# Patient Record
Sex: Female | Born: 1990 | Race: White | Hispanic: No | Marital: Single | Smoking: Never smoker
Health system: Southern US, Community
[De-identification: ages and names within clinical notes are randomized; demographics above are authoritative.]

## PROBLEM LIST (undated history)

## (undated) DIAGNOSIS — F419 Anxiety disorder, unspecified: Secondary | ICD-10-CM

## (undated) DIAGNOSIS — J302 Other seasonal allergic rhinitis: Secondary | ICD-10-CM

## (undated) DIAGNOSIS — K219 Gastro-esophageal reflux disease without esophagitis: Secondary | ICD-10-CM

## (undated) DIAGNOSIS — T7840XA Allergy, unspecified, initial encounter: Secondary | ICD-10-CM

## (undated) HISTORY — PX: ESOPHAGOGASTRODUODENOSCOPY ENDOSCOPY: SHX5814

## (undated) HISTORY — PX: TONSILLECTOMY: SUR1361

## (undated) HISTORY — DX: Gastro-esophageal reflux disease without esophagitis: K21.9

## (undated) HISTORY — DX: Allergy, unspecified, initial encounter: T78.40XA

## (undated) HISTORY — DX: Anxiety disorder, unspecified: F41.9

---
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
## 2011-03-18 NOTE — Op Note (Signed)
NAMECHERYLYNN, Natalie Ellis            ACCOUNT NO.:  [id number]   MEDICAL RECORD NO.:  [id number]          PATIENT TYPE:  AMB   LOCATION:  SDS                          FACILITY:  [facility]   PHYSICIAN:  Jon Gills, M.D.  DATE OF BIRTH:  07/29/1991   DATE OF PROCEDURE:  04/13/2009  DATE OF DISCHARGE:  04/13/2009                               OPERATIVE REPORT   PREOPERATIVE DIAGNOSIS:  Epigastric abdominal pain.   POSTOPERATIVE DIAGNOSIS:  Epigastric abdominal pain.   NAME OF PROCEDURE:  Upper gastrointestinal endoscopy with biopsy.   SURGEON:  Jon Gills, MD   ASSISTANT:  None.   DESCRIPTION OF FINDINGS:  Following informed written consent, the  patient was taken to the operating room and placed under general  anesthesia with continuous cardiopulmonary monitoring.  She remained in  the supine position, and the Pentax upper GI endoscope was passed by  mouth and advanced without difficulty.  A competent lower esophageal  sphincter was present 35 cm from the incisors.  There was no visual  evidence of esophagitis, gastritis, duodenitis, or peptic ulcer disease.  A solitary gastric biopsy was negative for Helicobacter by CLO testing.  Multiple esophageal, gastric, and duodenal biopsies were histologically  normal.  The endoscope was gradually withdrawn, and the patient was  awakened and taken to recovery room in satisfactory condition.  She will  be released later today to the care of her family.   DESCRIPTION OF TECHNICAL PROCEDURES USED:  Pentax upper GI endoscope  with cold biopsy forceps.   DESCRIPTION OF SPECIMENS REMOVED:  Esophagus x3 in formalin, gastric x 1  for CLOtesting, gastric x3 in formalin ,and duodenum x3 in formalin.           ______________________________  Jon Gills, M.D.     JHC/MEDQ  D:  04/27/2009  T:  04/27/2009  Job:  [id number]   cc:   Mila Merry

## 2011-05-05 ENCOUNTER — Emergency Department: Payer: Self-pay | Admitting: Emergency Medicine

## 2011-12-31 IMAGING — CT CT STONE STUDY
1 of 2 series · 15 of 32 positions shown, 19 images · non-contrast
Comparison: none

REASON FOR EXAM: pain right lower quadrant , hematuria and uti
COMMENTS:

[Series 2: stone · axial · 0.62mm/px · z∈[-468,-78]mm · 15 of 147 slices shown, 19 images]
[im 11/147  soft-tissue]
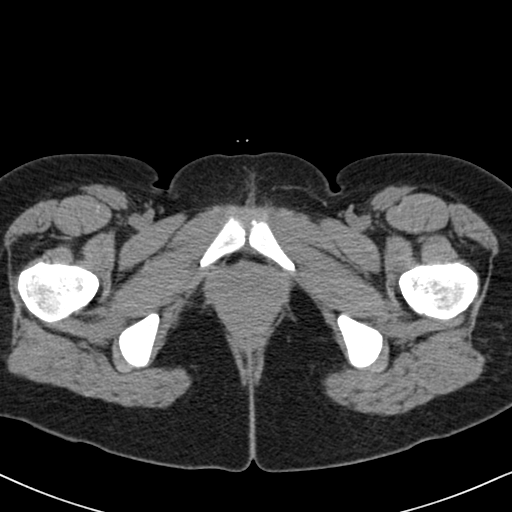
[im 11/147  bone]
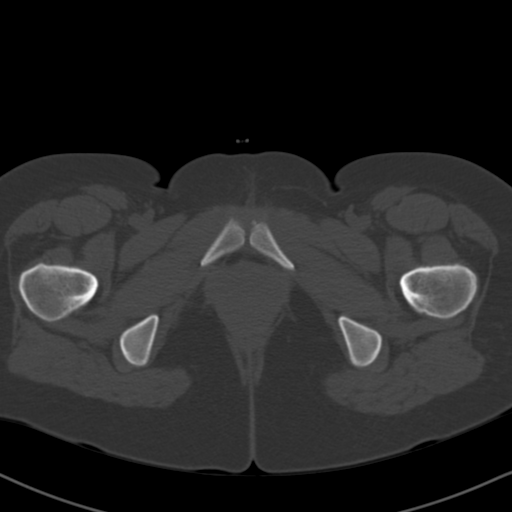
[im 22/147  soft-tissue]
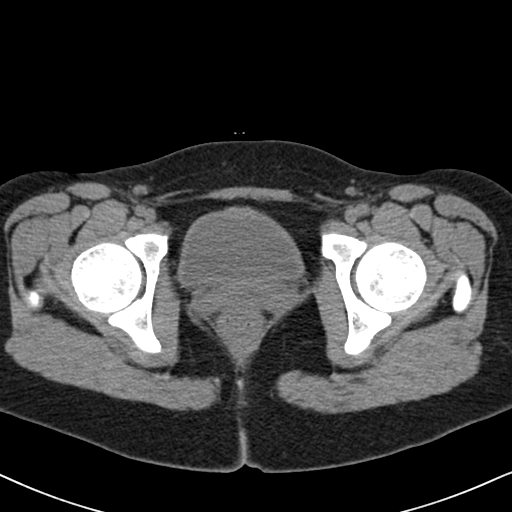
[im 33/147  soft-tissue]
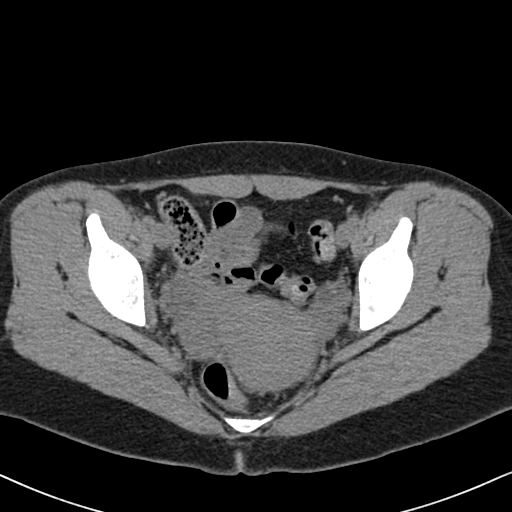
[im 44/147  soft-tissue]
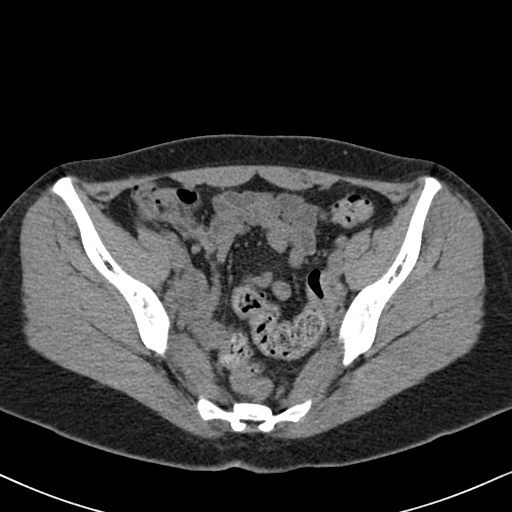
[im 55/147  soft-tissue]
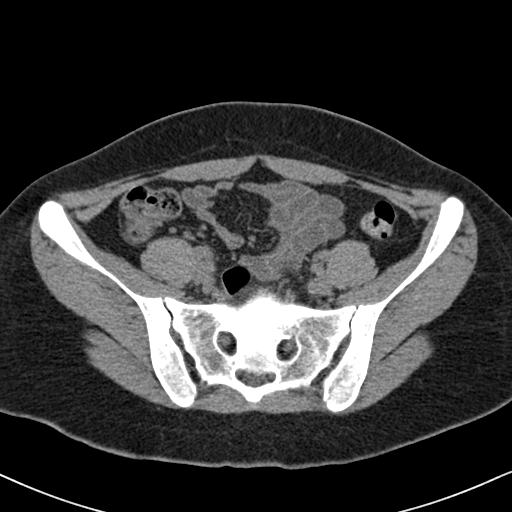
[im 65/147  soft-tissue]
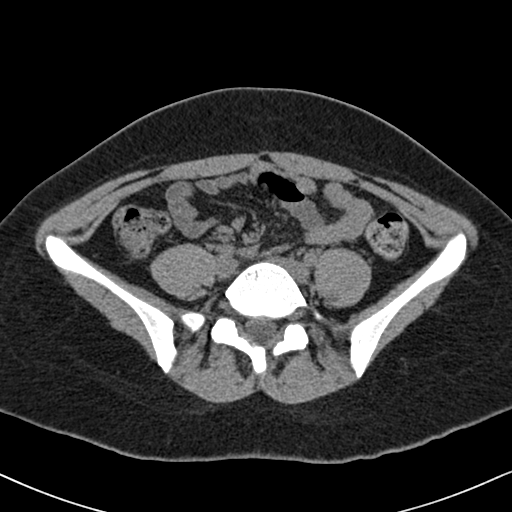
[im 76/147  soft-tissue]
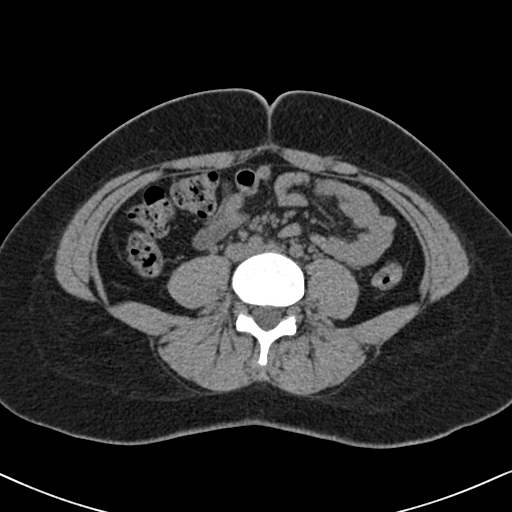
[im 87/147  soft-tissue]
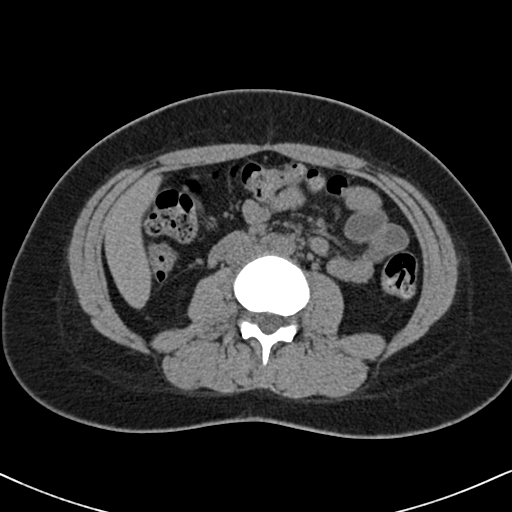
[im 98/147  soft-tissue]
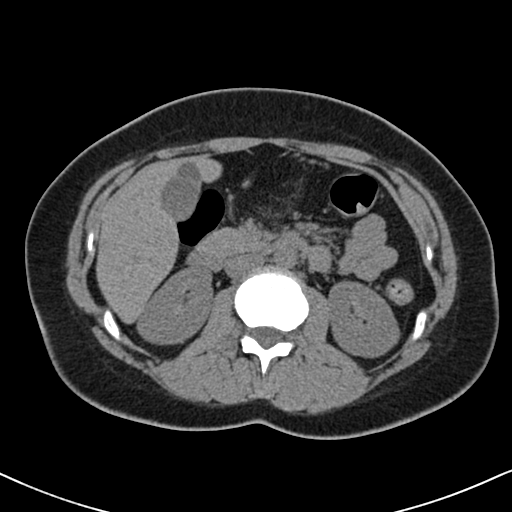
[im 98/147  bone]
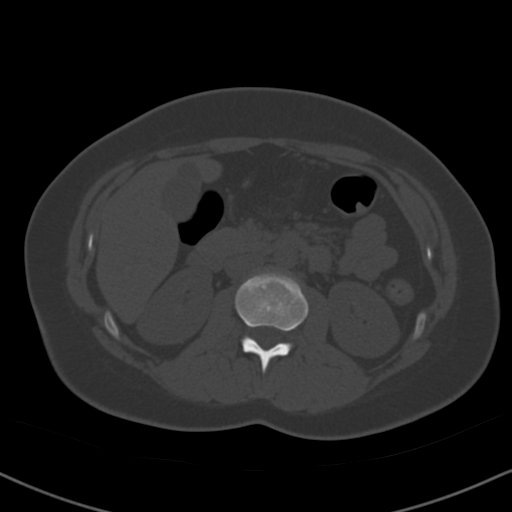
[im 109/147  soft-tissue]
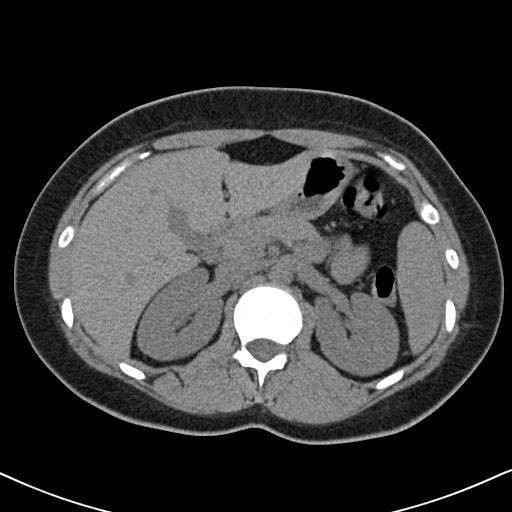
[im 119/147  soft-tissue]
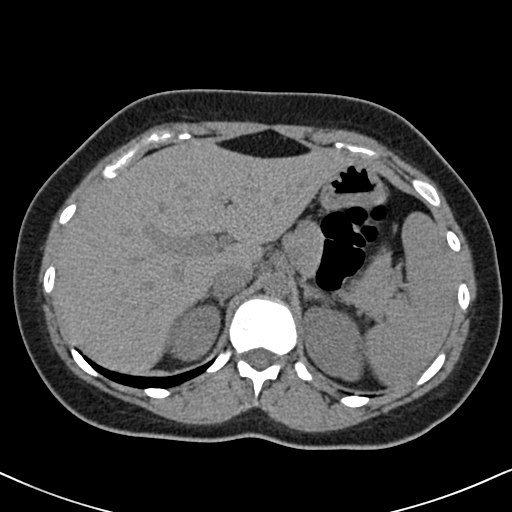
[im 125/147  lung]
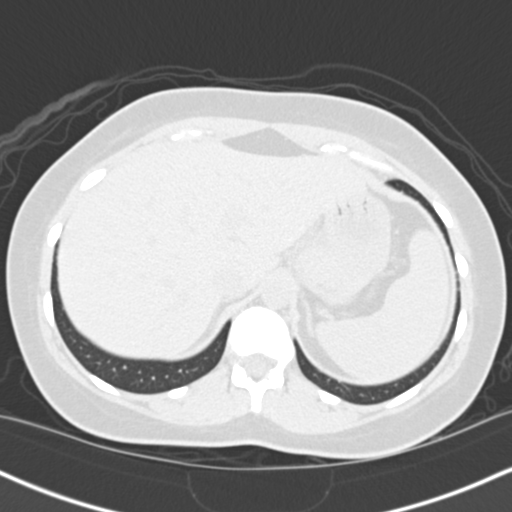
[im 130/147  soft-tissue]
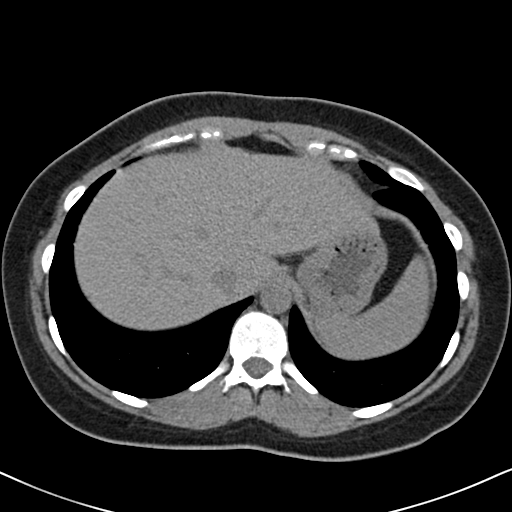
[im 130/147  lung]
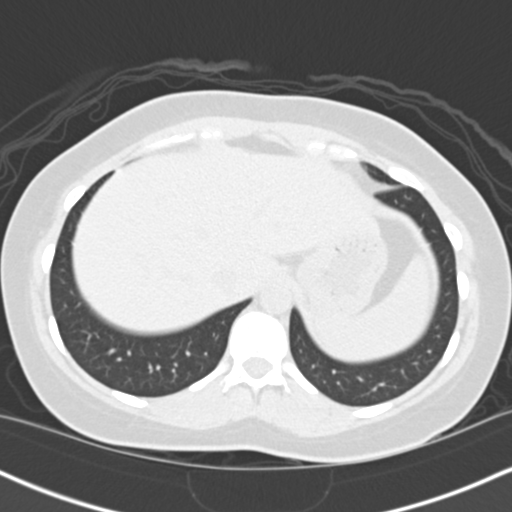
[im 136/147  lung]
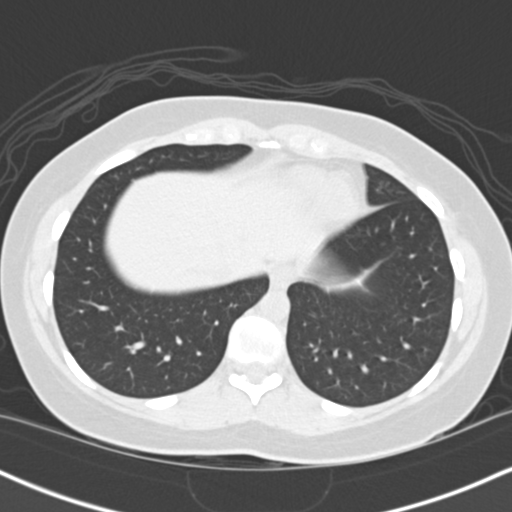
[im 141/147  soft-tissue]
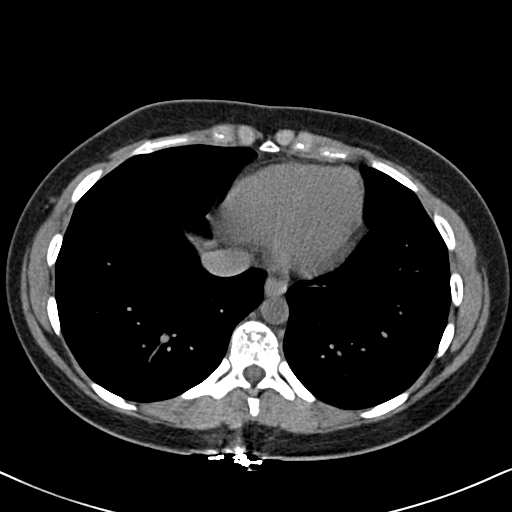
[im 141/147  lung]
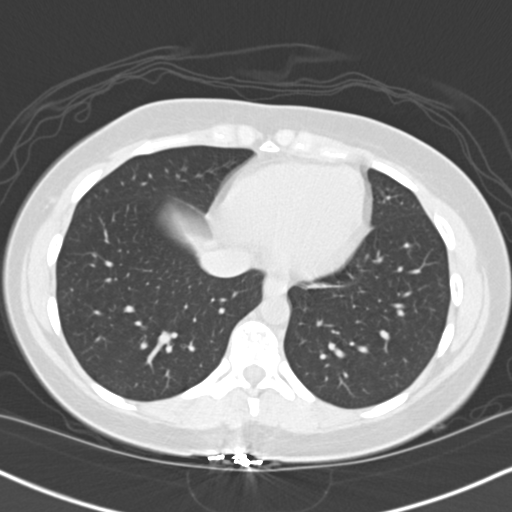

[15 of 32 positions shown; findings below may reference images not displayed]

PROCEDURE:     CT  - CT ABDOMEN /PELVIS WO (STONE)  - May 05, 2011  [DATE]

RESULT:     CT of the abdomen and pelvis without contrast is reconstructed
at 3.0 mm slice thickness. The patient has a previous CT performed with
contrast on one four 29 9131.

The kidneys demonstrate no obstruction or nephrolithiasis. No
ureterolithiasis is evident. No bladder calculi are demonstrated. The
visualized appendix appears within normal limits. Right adnexal fullness is
noted. Correlate for underlying adnexal lesion with consideration for pelvic
ultrasound. The appendix is a superior to this. The liver, spleen, kidneys,
gallbladder, pancreas and adrenal glands as well as the aorta appear
unremarkable. The urinary bladder is relatively decompressed. The lung bases
are clear.
IMPRESSION: 1. Right adnexal fullness. Pelvic ultrasound may be beneficial for further
investigation. Otherwise, unremarkable study.

## 2015-11-04 HISTORY — PX: WISDOM TOOTH EXTRACTION: SHX21

## 2016-01-24 ENCOUNTER — Emergency Department (HOSPITAL_COMMUNITY)
Admission: EM | Admit: 2016-01-24 | Discharge: 2016-01-25 | Disposition: A | Discharge status: Home / Self Care | Payer: Self-pay | Attending: Emergency Medicine | Admitting: Emergency Medicine

## 2016-01-24 ENCOUNTER — Encounter (HOSPITAL_COMMUNITY): Payer: Self-pay | Admitting: Emergency Medicine

## 2016-01-24 DIAGNOSIS — W228XXA Striking against or struck by other objects, initial encounter: Secondary | ICD-10-CM | POA: Insufficient documentation

## 2016-01-24 DIAGNOSIS — Y998 Other external cause status: Secondary | ICD-10-CM | POA: Insufficient documentation

## 2016-01-24 DIAGNOSIS — Y9289 Other specified places as the place of occurrence of the external cause: Secondary | ICD-10-CM | POA: Insufficient documentation

## 2016-01-24 DIAGNOSIS — Y9389 Activity, other specified: Secondary | ICD-10-CM | POA: Insufficient documentation

## 2016-01-24 DIAGNOSIS — Z3202 Encounter for pregnancy test, result negative: Secondary | ICD-10-CM | POA: Insufficient documentation

## 2016-01-24 DIAGNOSIS — S060X0A Concussion without loss of consciousness, initial encounter: Secondary | ICD-10-CM | POA: Insufficient documentation

## 2016-01-24 DIAGNOSIS — Z79899 Other long term (current) drug therapy: Secondary | ICD-10-CM | POA: Insufficient documentation

## 2016-01-24 NOTE — ED Notes (Signed)
Pt was trying to lay back on her bed but accidentally hit the back of her head on the wall. Pt's friend states she blacked out for a few second "7-8". The pt states she then was really lethargic afterwards and now has complaints of a headache

## 2016-01-25 LAB — POC URINE PREG, ED: Preg Test, Ur: NEGATIVE

## 2016-01-25 MED ORDER — KETOROLAC TROMETHAMINE 15 MG/ML IJ SOLN
15.0000 mg | Freq: Once | INTRAMUSCULAR | Status: AC
  Administered 2016-01-25: 15 mg via INTRAMUSCULAR
  Filled 2016-01-25: qty 1

## 2016-01-25 MED ORDER — PROMETHAZINE HCL 25 MG PO TABS: 25.0000 mg | ORAL_TABLET | Freq: Four times a day (QID) | ORAL | Status: DC | PRN

## 2016-01-25 MED ORDER — BUTALBITAL-APAP-CAFFEINE 50-325-40 MG PO TABS: 1.0000 | ORAL_TABLET | Freq: Three times a day (TID) | ORAL | Status: AC | PRN

## 2016-01-25 NOTE — Discharge Instructions (Signed)
Your neurologic exam today was normal. We have a low suspicion for bleeding in your brain, fluid around your brain, or a skull fracture. For this reason, he did not have a CT performed. You have been placed on head injury precautions. While on these precautions, you may not drive a motor vehicle or operate heavy machinery. No strenuous activity or heavy lifting. No contact sports. Follow-up with your primary care doctor in 1 week to be cleared from these precautions. Take Fioricet as needed for headache. You may take Phenergan as needed for nausea. Return to the emergency department if symptoms worsen.  Concussion, Adult A concussion, or closed-head injury, is a brain injury caused by a direct blow to the head or by a quick and sudden movement (jolt) of the head or neck. Concussions are usually not life-threatening. Even so, the effects of a concussion can be serious. If you have had a concussion before, you are more likely to experience concussion-like symptoms after a direct blow to the head.  CAUSES  Direct blow to the head, such as from running into another player during a soccer game, being hit in a fight, or hitting your head on a hard surface.  A jolt of the head or neck that causes the brain to move back and forth inside the skull, such as in a car crash. SIGNS AND SYMPTOMS The signs of a concussion can be hard to notice. Early on, they may be missed by you, family members, and health care providers. You may look fine but act or feel differently. Symptoms are usually temporary, but they may last for days, weeks, or even longer. Some symptoms may appear right away while others may not show up for hours or days. Every head injury is different. Symptoms include:  Mild to moderate headaches that will not go away.  A feeling of pressure inside your head.  Having more trouble than usual:  Learning or remembering things you have heard.  Answering questions.  Paying attention or  concentrating.  Organizing daily tasks.  Making decisions and solving problems.  Slowness in thinking, acting or reacting, speaking, or reading.  Getting lost or being easily confused.  Feeling tired all the time or lacking energy (fatigued).  Feeling drowsy.  Sleep disturbances.  Sleeping more than usual.  Sleeping less than usual.  Trouble falling asleep.  Trouble sleeping (insomnia).  Loss of balance or feeling lightheaded or dizzy.  Nausea or vomiting.  Numbness or tingling.  Increased sensitivity to:  Sounds.  Lights.  Distractions.  Vision problems or eyes that tire easily.  Diminished sense of taste or smell.  Ringing in the ears.  Mood changes such as feeling sad or anxious.  Becoming easily irritated or angry for little or no reason.  Lack of motivation.  Seeing or hearing things other people do not see or hear (hallucinations). DIAGNOSIS Your health care provider can usually diagnose a concussion based on a description of your injury and symptoms. He or she will ask whether you passed out (lost consciousness) and whether you are having trouble remembering events that happened right before and during your injury. Your evaluation might include:  A brain scan to look for signs of injury to the brain. Even if the test shows no injury, you may still have a concussion.  Blood tests to be sure other problems are not present. TREATMENT  Concussions are usually treated in an emergency department, in urgent care, or at a clinic. You may need to stay in  the hospital overnight for further treatment.  Tell your health care provider if you are taking any medicines, including prescription medicines, over-the-counter medicines, and natural remedies. Some medicines, such as blood thinners (anticoagulants) and aspirin, may increase the chance of complications. Also tell your health care provider whether you have had alcohol or are taking illegal drugs. This  information may affect treatment.  Your health care provider will send you home with important instructions to follow.  How fast you will recover from a concussion depends on many factors. These factors include how severe your concussion is, what part of your brain was injured, your age, and how healthy you were before the concussion.  Most people with mild injuries recover fully. Recovery can take time. In general, recovery is slower in older persons. Also, persons who have had a concussion in the past or have other medical problems may find that it takes longer to recover from their current injury. HOME CARE INSTRUCTIONS General Instructions  Carefully follow the directions your health care provider gave you.  Only take over-the-counter or prescription medicines for pain, discomfort, or fever as directed by your health care provider.  Take only those medicines that your health care provider has approved.  Do not drink alcohol until your health care provider says you are well enough to do so. Alcohol and certain other drugs may slow your recovery and can put you at risk of further injury.  If it is harder than usual to remember things, write them down.  If you are easily distracted, try to do one thing at a time. For example, do not try to watch TV while fixing dinner.  Talk with family members or close friends when making important decisions.  Keep all follow-up appointments. Repeated evaluation of your symptoms is recommended for your recovery.  Watch your symptoms and tell others to do the same. Complications sometimes occur after a concussion. Older adults with a brain injury may have a higher risk of serious complications, such as a blood clot on the brain.  Tell your teachers, school nurse, school counselor, coach, athletic trainer, or work Production designer, theatre/television/filmmanager about your injury, symptoms, and restrictions. Tell them about what you can or cannot do. They should watch for:  Increased problems  with attention or concentration.  Increased difficulty remembering or learning new information.  Increased time needed to complete tasks or assignments.  Increased irritability or decreased ability to cope with stress.  Increased symptoms.  Rest. Rest helps the brain to heal. Make sure you:  Get plenty of sleep at night. Avoid staying up late at night.  Keep the same bedtime hours on weekends and weekdays.  Rest during the day. Take daytime naps or rest breaks when you feel tired.  Limit activities that require a lot of thought or concentration. These include:  Doing homework or job-related work.  Watching TV.  Working on the computer.  Avoid any situation where there is potential for another head injury (football, hockey, soccer, basketball, martial arts, downhill snow sports and horseback riding). Your condition will get worse every time you experience a concussion. You should avoid these activities until you are evaluated by the appropriate follow-up health care providers. Returning To Your Regular Activities You will need to return to your normal activities slowly, not all at once. You must give your body and brain enough time for recovery.  Do not return to sports or other athletic activities until your health care provider tells you it is safe to do so.  Ask your health care provider when you can drive, ride a bicycle, or operate heavy machinery. Your ability to react may be slower after a brain injury. Never do these activities if you are dizzy.  Ask your health care provider about when you can return to work or school. Preventing Another Concussion It is very important to avoid another brain injury, especially before you have recovered. In rare cases, another injury can lead to permanent brain damage, brain swelling, or death. The risk of this is greatest during the first 7-10 days after a head injury. Avoid injuries by:  Wearing a seat belt when riding in a  car.  Drinking alcohol only in moderation.  Wearing a helmet when biking, skiing, skateboarding, skating, or doing similar activities.  Avoiding activities that could lead to a second concussion, such as contact or recreational sports, until your health care provider says it is okay.  Taking safety measures in your home.  Remove clutter and tripping hazards from floors and stairways.  Use grab bars in bathrooms and handrails by stairs.  Place non-slip mats on floors and in bathtubs.  Improve lighting in dim areas. SEEK MEDICAL CARE IF:  You have increased problems paying attention or concentrating.  You have increased difficulty remembering or learning new information.  You need more time to complete tasks or assignments than before.  You have increased irritability or decreased ability to cope with stress.  You have more symptoms than before. Seek medical care if you have any of the following symptoms for more than 2 weeks after your injury:  Lasting (chronic) headaches.  Dizziness or balance problems.  Nausea.  Vision problems.  Increased sensitivity to noise or light.  Depression or mood swings.  Anxiety or irritability.  Memory problems.  Difficulty concentrating or paying attention.  Sleep problems.  Feeling tired all the time. SEEK IMMEDIATE MEDICAL CARE IF:  You have severe or worsening headaches. These may be a sign of a blood clot in the brain.  You have weakness (even if only in one hand, leg, or part of the face).  You have numbness.  You have decreased coordination.  You vomit repeatedly.  You have increased sleepiness.  One pupil is larger than the other.  You have convulsions.  You have slurred speech.  You have increased confusion. This may be a sign of a blood clot in the brain.  You have increased restlessness, agitation, or irritability.  You are unable to recognize people or places.  You have neck pain.  It is difficult  to wake you up.  You have unusual behavior changes.  You lose consciousness. MAKE SURE YOU:  Understand these instructions.  Will watch your condition.  Will get help right away if you are not doing well or get worse.   This information is not intended to replace advice given to you by your health care provider. Make sure you discuss any questions you have with your health care provider.   Document Released: 01/10/2004 Document Revised: 11/10/2014 Document Reviewed: 05/12/2013 Elsevier Interactive Patient Education [employer]

## 2016-01-25 NOTE — ED Provider Notes (Signed)
CSN: [id number]     Arrival date & time 01/24/16  2218 History   First MD Initiated Contact with Patient 01/25/16 0024     Chief Complaint  Patient presents with  . Head Injury     (Consider location/radiation/quality/duration/timing/severity/associated sxs/prior Treatment) HPI Comments: 25 year old female with no significant past medical history presents to the emergency department for further evaluation of head injury which occurred this evening at 2110. Patient reports that she was laying back on the bed when her head went into the wall. She denies loss of consciousness, but does report seeing black spots for 6-7 seconds. Patient felt tired and fell asleep shortly after. She is complaining of a posterior parietal headache with associated photophobia and intermittent blurry vision. Patient reports nausea without emesis. No extremity numbness or weakness. No medications taken prior to arrival.  Patient is a 25 y.o. female presenting with head injury. The history is provided by the patient. No language interpreter was used.  Head Injury Associated symptoms: headache and nausea   Associated symptoms: no hearing loss, no numbness, no tinnitus and no vomiting     History reviewed. No pertinent past medical history. Past Surgical History  Procedure Laterality Date  . Tonsillectomy     No family history on file. Social History  Substance Use Topics  . Smoking status: Never Smoker   . Smokeless tobacco: None  . Alcohol Use: Yes     Comment: socially   OB History    No data available      Review of Systems  HENT: Negative for hearing loss and tinnitus.   Eyes: Positive for photophobia and visual disturbance.  Gastrointestinal: Positive for nausea. Negative for vomiting.  Musculoskeletal: Negative for gait problem.  Neurological: Positive for headaches. Negative for syncope, weakness and numbness.  All other systems reviewed and are negative.   Allergies  Other  Home  Medications   Prior to Admission medications   Medication Sig Start Date End Date Taking? Authorizing Provider  Phenylephrine-Acetaminophen (VICKS SINEX DAYTIME PO) Take 1 capsule by mouth every 6 (six) hours.   Yes Historical Provider, MD  butalbital-acetaminophen-caffeine (FIORICET) 50-325-40 MG tablet Take 1-2 tablets by mouth every 8 (eight) hours as needed for headache or migraine. 01/25/16 01/24/17  Antony MaduraKelly Olumide Dolinger, PA-C  promethazine (PHENERGAN) 25 MG tablet Take 1 tablet (25 mg total) by mouth every 6 (six) hours as needed for nausea or vomiting. 01/25/16   Antony MaduraKelly Bambie Pizzolato, PA-C   BP 124/88 mmHg  Pulse 80  Temp(Src) 98.1 F (36.7 C) (Oral)  Resp 16  Ht 5\' 7"  (1.702 m)  Wt 68.04 kg  BMI 23.49 kg/m2  SpO2 100%   Physical Exam  Constitutional: She is oriented to person, place, and time. She appears well-developed and well-nourished. No distress.  HENT:  Head: Normocephalic and atraumatic.  Mouth/Throat: Oropharynx is clear and moist. No oropharyngeal exudate.  No Battle sign or raccoons eyes. No skull and stability, contusion, or hematoma to scalp. Symmetric rise of the uvula with phonation.  Eyes: Conjunctivae and EOM are normal. Pupils are equal, round, and reactive to light. No scleral icterus.  EOMs normal without nystagmus.  Neck: Normal range of motion.  No nuchal rigidity or meningismus  Cardiovascular: Normal rate, regular rhythm and intact distal pulses.   Pulmonary/Chest: Effort normal. No respiratory distress.  Respirations even and unlabored  Musculoskeletal: Normal range of motion.  Neurological: She is alert and oriented to person, place, and time. No cranial nerve deficit. She exhibits normal muscle tone.  Coordination normal.  GCS 15. Speech is goal oriented. No cranial nerve deficits appreciated; symmetric eyebrow raise, no facial drooping, tongue midline. Patient has equal grip strength bilaterally with 5/5 strength against resistance in all major muscle groups  bilaterally. Sensation to light touch intact. Patient moves extremities without ataxia; normal finger-nose-finger. Patient ambulatory with steady gait.  Skin: Skin is warm and dry. No rash noted. She is not diaphoretic. No erythema. No pallor.  Psychiatric: She has a normal mood and affect. Her behavior is normal.  Nursing note and vitals reviewed.   ED Course  Procedures (including critical care time) Labs Review Labs Reviewed  POC URINE PREG, ED    Imaging Review No results found.   I have personally reviewed and evaluated these images and lab results as part of my medical decision-making.   EKG Interpretation None      MDM   Final diagnoses:  Concussion, without loss of consciousness, initial encounter    25 year old female presents to the emergency department for evaluation of a head injury secondary to hitting her head on a wall tonight. No reported LOC. No Battle sign or raccoons eyes on exam. Neurologic exam nonfocal. Patient complaining of a residual posterior parietal headache consistent with posttraumatic headache. Doubt skull fracture, hemorrhage, hydrocephalus. No indication for CT at this time. Patient treated with Toradol for headache. She has been placed on head injury precautions were one week. Return precautions discussed and provided. Patient discharged in satisfactory condition area and patient and family with no unaddressed concerns.   Filed Vitals:   01/24/16 2232  BP: 124/88  Pulse: 80  Temp: 98.1 F (36.7 C)  TempSrc: Oral  Resp: 16  Height:  (1.702 m)  Weight: 68.04 kg  SpO2: 100%     Antony Madura, PA-C 01/25/16 0209  Cy Blamer, MD 01/25/16 [phone]

## 2017-04-06 ENCOUNTER — Encounter: Payer: Self-pay | Admitting: Obstetrics and Gynecology

## 2017-04-09 ENCOUNTER — Ambulatory Visit (INDEPENDENT_AMBULATORY_CARE_PROVIDER_SITE_OTHER): Payer: Self-pay | Admitting: Obstetrics & Gynecology

## 2017-04-09 ENCOUNTER — Other Ambulatory Visit (HOSPITAL_COMMUNITY)
Admission: RE | Admit: 2017-04-09 | Discharge: 2017-04-09 | Disposition: A | Discharge status: Home / Self Care | Payer: Medicaid Other | Source: Ambulatory Visit | Attending: Obstetrics and Gynecology | Admitting: Obstetrics and Gynecology

## 2017-04-09 VITALS — BP 135/83 | HR 67 | Wt 157.2 lb

## 2017-04-09 DIAGNOSIS — G8929 Other chronic pain: Secondary | ICD-10-CM

## 2017-04-09 DIAGNOSIS — N938 Other specified abnormal uterine and vaginal bleeding: Secondary | ICD-10-CM | POA: Insufficient documentation

## 2017-04-09 DIAGNOSIS — R102 Pelvic and perineal pain: Secondary | ICD-10-CM | POA: Insufficient documentation

## 2017-04-09 NOTE — Progress Notes (Signed)
Patient reports taking birth control pills but does not know the name of them.

## 2017-04-09 NOTE — Addendum Note (Signed)
Addended by: Nicholaus BloomVE, Jaysen Wey on: 04/09/2017 08:48 AM   Modules accepted: Orders

## 2017-04-09 NOTE — Addendum Note (Signed)
Addended by: Sherre LainASH, Melody Savidge on: 04/09/2017 03:26 PM   Modules accepted: Orders

## 2017-04-09 NOTE — Progress Notes (Signed)
   Subjective:    Patient ID: Natalie LimberChelsea D Manlove, female    DOB: [birth year], 26 y.o.   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 She has now taken 2 months of continuous OCPs, is still bleeding and her pain is not improved. [facility] showed a possible intrauterine polyp as well as possible endometriomas.   Review of Systems She works at [employer], sometimes has to leave work due to the pain. She is currenlty in a same sex relationship, has had sex with males in the past. No pap ever    Objective:   Physical Exam [id number] Breathing, conversing, and ambulating normally Abd- benign       Assessment & Plan:  DUB on OCPs- ? Polyp on gyn u/s Check GC,CT, TSH Probable endometriosis- discussed OCPs versus dx lap. She would like dx lap DUB with possible polyp- plan for h/s, d&c at time of dx lap.

## 2017-04-10 LAB — URINE CYTOLOGY ANCILLARY ONLY
Chlamydia: NEGATIVE
NEISSERIA GONORRHEA: NEGATIVE

## 2017-04-10 LAB — TSH: TSH: 4.8 u[IU]/mL — ABNORMAL HIGH (ref 0.450–4.500)

## 2017-04-13 ENCOUNTER — Encounter (HOSPITAL_COMMUNITY): Payer: Self-pay

## 2017-04-14 ENCOUNTER — Telehealth: Payer: Self-pay

## 2017-04-14 NOTE — Telephone Encounter (Signed)
-----   Message from Allie BossierMyra C Dove, MD sent at 04/14/2017  1:46 PM EDT ----- She will need to see a primary care regarding her abnormal TSH. Thanks

## 2017-04-14 NOTE — Telephone Encounter (Signed)
Patient has been informed of TSH results.

## 2017-04-20 NOTE — Patient Instructions (Addendum)
Your procedure is scheduled on:  Thursday, June 21  Enter through the Main Entrance of [facility] at: 8:45 am  Pick up the phone at the desk and dial [phone].  Call this number if you have problems the morning of surgery: [phone].  Remember: Do NOT eat or drink clear liquids (including water) after midnight Wednesday  Take these medicines the morning of surgery with a SIP OF WATER:  None  Do Not smoke on the day of surgery.  Stop herbal medications and supplements at this time.  Do NOT wear jewelry (body piercing), metal hair clips/bobby pins, make-up, or nail polish. Do NOT wear lotions, powders, or perfumes.  You may wear deoderant. Do NOT shave for 48 hours prior to surgery. Do NOT bring valuables to the hospital.  Have a responsible adult drive you home and stay with you for 24 hours after your procedure.  Home with girlfriend Clydie BraunKaren cell [phone]

## 2017-04-21 ENCOUNTER — Encounter (HOSPITAL_COMMUNITY): Payer: Self-pay | Admitting: *Deleted

## 2017-04-21 ENCOUNTER — Encounter (HOSPITAL_COMMUNITY)
Admission: RE | Admit: 2017-04-21 | Discharge: 2017-04-21 | Disposition: A | Discharge status: Home / Self Care | Payer: Medicaid Other | Source: Ambulatory Visit | Attending: Obstetrics & Gynecology | Admitting: Obstetrics & Gynecology

## 2017-04-21 DIAGNOSIS — R102 Pelvic and perineal pain: Secondary | ICD-10-CM | POA: Insufficient documentation

## 2017-04-21 DIAGNOSIS — Z01812 Encounter for preprocedural laboratory examination: Secondary | ICD-10-CM | POA: Insufficient documentation

## 2017-04-21 HISTORY — DX: Other seasonal allergic rhinitis: J30.2

## 2017-04-21 LAB — CBC
HCT: 37.5 % (ref 36.0–46.0)
HEMOGLOBIN: 12.2 g/dL (ref 12.0–15.0)
MCH: 29.1 pg (ref 26.0–34.0)
MCHC: 32.5 g/dL (ref 30.0–36.0)
MCV: 89.5 fL (ref 78.0–100.0)
Platelets: 287 10*3/uL (ref 150–400)
RBC: 4.19 MIL/uL (ref 3.87–5.11)
RDW: 14 % (ref 11.5–15.5)
WBC: 8.2 10*3/uL (ref 4.0–10.5)

## 2017-04-23 ENCOUNTER — Ambulatory Visit (HOSPITAL_COMMUNITY)
Admission: RE | Admit: 2017-04-23 | Discharge: 2017-04-23 | Disposition: A | Discharge status: Home / Self Care | Payer: Self-pay | Source: Ambulatory Visit | Attending: Obstetrics & Gynecology | Admitting: Obstetrics & Gynecology

## 2017-04-23 ENCOUNTER — Encounter (HOSPITAL_COMMUNITY): Payer: Self-pay | Admitting: *Deleted

## 2017-04-23 ENCOUNTER — Encounter (HOSPITAL_COMMUNITY)
Admission: RE | Disposition: A | Discharge status: Home / Self Care | Payer: Self-pay | Source: Ambulatory Visit | Attending: Obstetrics & Gynecology

## 2017-04-23 ENCOUNTER — Ambulatory Visit (HOSPITAL_COMMUNITY): Payer: Self-pay | Admitting: Anesthesiology

## 2017-04-23 DIAGNOSIS — N840 Polyp of corpus uteri: Secondary | ICD-10-CM | POA: Insufficient documentation

## 2017-04-23 DIAGNOSIS — F17210 Nicotine dependence, cigarettes, uncomplicated: Secondary | ICD-10-CM | POA: Insufficient documentation

## 2017-04-23 DIAGNOSIS — N803 Endometriosis of pelvic peritoneum: Secondary | ICD-10-CM | POA: Insufficient documentation

## 2017-04-23 DIAGNOSIS — N938 Other specified abnormal uterine and vaginal bleeding: Secondary | ICD-10-CM | POA: Insufficient documentation

## 2017-04-23 DIAGNOSIS — R102 Pelvic and perineal pain: Secondary | ICD-10-CM

## 2017-04-23 DIAGNOSIS — G8929 Other chronic pain: Secondary | ICD-10-CM | POA: Insufficient documentation

## 2017-04-23 DIAGNOSIS — Z91018 Allergy to other foods: Secondary | ICD-10-CM | POA: Insufficient documentation

## 2017-04-23 DIAGNOSIS — Z79899 Other long term (current) drug therapy: Secondary | ICD-10-CM | POA: Insufficient documentation

## 2017-04-23 DIAGNOSIS — N7011 Chronic salpingitis: Secondary | ICD-10-CM | POA: Insufficient documentation

## 2017-04-23 HISTORY — PX: HYSTEROSCOPY WITH D & C: SHX1775

## 2017-04-23 HISTORY — PX: LAPAROSCOPY: SHX197

## 2017-04-23 LAB — PREGNANCY, URINE: PREG TEST UR: NEGATIVE

## 2017-04-23 SURGERY — LAPAROSCOPY, DIAGNOSTIC
Anesthesia: General | Site: Vagina

## 2017-04-23 MED ORDER — BUPIVACAINE HCL (PF) 0.5 % IJ SOLN
INTRAMUSCULAR | Status: DC | PRN
  Administered 2017-04-23: 10 mL

## 2017-04-23 MED ORDER — KETOROLAC TROMETHAMINE 30 MG/ML IJ SOLN
INTRAMUSCULAR | Status: DC | PRN
  Administered 2017-04-23: 30 mg via INTRAVENOUS

## 2017-04-23 MED ORDER — FENTANYL CITRATE (PF) 250 MCG/5ML IJ SOLN
INTRAMUSCULAR | Status: DC | PRN
  Administered 2017-04-23: 100 ug via INTRAVENOUS
  Administered 2017-04-23 (×3): 50 ug via INTRAVENOUS

## 2017-04-23 MED ORDER — FENTANYL CITRATE (PF) 250 MCG/5ML IJ SOLN
INTRAMUSCULAR | Status: AC
  Filled 2017-04-23: qty 5

## 2017-04-23 MED ORDER — MEPERIDINE HCL 25 MG/ML IJ SOLN: 6.2500 mg | INTRAMUSCULAR | Status: DC | PRN

## 2017-04-23 MED ORDER — LIDOCAINE HCL (CARDIAC) 20 MG/ML IV SOLN
INTRAVENOUS | Status: DC | PRN
  Administered 2017-04-23: 50 mg via INTRAVENOUS
  Administered 2017-04-23: 20 mg via INTRAVENOUS
  Administered 2017-04-23: 30 mg via INTRAVENOUS

## 2017-04-23 MED ORDER — MIDAZOLAM HCL 5 MG/5ML IJ SOLN
INTRAMUSCULAR | Status: DC | PRN
  Administered 2017-04-23: 2 mg via INTRAVENOUS

## 2017-04-23 MED ORDER — DEXAMETHASONE SODIUM PHOSPHATE 10 MG/ML IJ SOLN
INTRAMUSCULAR | Status: DC | PRN
  Administered 2017-04-23: 4 mg via INTRAVENOUS

## 2017-04-23 MED ORDER — SUGAMMADEX SODIUM 500 MG/5ML IV SOLN
INTRAVENOUS | Status: DC | PRN
  Administered 2017-04-23: 200 mg via INTRAVENOUS

## 2017-04-23 MED ORDER — DEXAMETHASONE SODIUM PHOSPHATE 4 MG/ML IJ SOLN
INTRAMUSCULAR | Status: AC
  Filled 2017-04-23: qty 1

## 2017-04-23 MED ORDER — SCOPOLAMINE 1 MG/3DAYS TD PT72
1.0000 | MEDICATED_PATCH | Freq: Once | TRANSDERMAL | Status: DC
  Administered 2017-04-23: 1.5 mg via TRANSDERMAL

## 2017-04-23 MED ORDER — ACETAMINOPHEN 325 MG PO TABS: 325.0000 mg | ORAL_TABLET | ORAL | Status: DC | PRN

## 2017-04-23 MED ORDER — ACETAMINOPHEN 160 MG/5ML PO SOLN: 325.0000 mg | ORAL | Status: DC | PRN

## 2017-04-23 MED ORDER — LIDOCAINE HCL (CARDIAC) 20 MG/ML IV SOLN
INTRAVENOUS | Status: AC
  Filled 2017-04-23: qty 5

## 2017-04-23 MED ORDER — MIDAZOLAM HCL 2 MG/2ML IJ SOLN
INTRAMUSCULAR | Status: AC
  Filled 2017-04-23: qty 2

## 2017-04-23 MED ORDER — SUGAMMADEX SODIUM 200 MG/2ML IV SOLN
INTRAVENOUS | Status: AC
  Filled 2017-04-23: qty 2

## 2017-04-23 MED ORDER — KETOROLAC TROMETHAMINE 30 MG/ML IJ SOLN
INTRAMUSCULAR | Status: AC
  Filled 2017-04-23: qty 1

## 2017-04-23 MED ORDER — OXYCODONE-ACETAMINOPHEN 5-325 MG PO TABS: 1.0000 | ORAL_TABLET | Freq: Four times a day (QID) | ORAL | 0 refills | Status: DC | PRN

## 2017-04-23 MED ORDER — IBUPROFEN 600 MG PO TABS: 600.0000 mg | ORAL_TABLET | Freq: Four times a day (QID) | ORAL | 1 refills | Status: DC | PRN

## 2017-04-23 MED ORDER — KETOROLAC TROMETHAMINE 30 MG/ML IJ SOLN: 30.0000 mg | Freq: Once | INTRAMUSCULAR | Status: DC | PRN

## 2017-04-23 MED ORDER — SCOPOLAMINE 1 MG/3DAYS TD PT72
MEDICATED_PATCH | TRANSDERMAL | Status: AC
  Administered 2017-04-23: 1.5 mg via TRANSDERMAL
  Filled 2017-04-23: qty 1

## 2017-04-23 MED ORDER — PROPOFOL 10 MG/ML IV BOLUS
INTRAVENOUS | Status: DC | PRN
  Administered 2017-04-23: 150 mg via INTRAVENOUS

## 2017-04-23 MED ORDER — ONDANSETRON HCL 4 MG/2ML IJ SOLN: 4.0000 mg | Freq: Once | INTRAMUSCULAR | Status: DC | PRN

## 2017-04-23 MED ORDER — FENTANYL CITRATE (PF) 100 MCG/2ML IJ SOLN
INTRAMUSCULAR | Status: AC
  Filled 2017-04-23: qty 2

## 2017-04-23 MED ORDER — LACTATED RINGERS IV SOLN
INTRAVENOUS | Status: DC
  Administered 2017-04-23 (×2): via INTRAVENOUS

## 2017-04-23 MED ORDER — PROPOFOL 10 MG/ML IV BOLUS
INTRAVENOUS | Status: AC
  Filled 2017-04-23: qty 20

## 2017-04-23 MED ORDER — ONDANSETRON HCL 4 MG/2ML IJ SOLN
INTRAMUSCULAR | Status: DC | PRN
  Administered 2017-04-23: 4 mg via INTRAVENOUS

## 2017-04-23 MED ORDER — FENTANYL CITRATE (PF) 100 MCG/2ML IJ SOLN
25.0000 ug | INTRAMUSCULAR | Status: DC | PRN
  Administered 2017-04-23: 50 ug via INTRAVENOUS

## 2017-04-23 MED ORDER — LACTATED RINGERS IR SOLN
Status: DC | PRN
  Administered 2017-04-23: 3000 mL

## 2017-04-23 MED ORDER — ROCURONIUM BROMIDE 100 MG/10ML IV SOLN
INTRAVENOUS | Status: DC | PRN
  Administered 2017-04-23: 10 mg via INTRAVENOUS
  Administered 2017-04-23: 35 mg via INTRAVENOUS

## 2017-04-23 MED ORDER — ONDANSETRON HCL 4 MG/2ML IJ SOLN
INTRAMUSCULAR | Status: AC
  Filled 2017-04-23: qty 2

## 2017-04-23 MED ORDER — OXYCODONE HCL 5 MG/5ML PO SOLN: 5.0000 mg | Freq: Once | ORAL | Status: DC | PRN

## 2017-04-23 MED ORDER — OXYCODONE HCL 5 MG PO TABS: 5.0000 mg | ORAL_TABLET | Freq: Once | ORAL | Status: DC | PRN

## 2017-04-23 MED ORDER — BUPIVACAINE HCL (PF) 0.5 % IJ SOLN
INTRAMUSCULAR | Status: AC
  Filled 2017-04-23: qty 30

## 2017-04-23 SURGICAL SUPPLY — 45 items
APPLICATOR COTTON TIP 6IN STRL (MISCELLANEOUS) IMPLANT
BIPOLAR CUTTING LOOP 21FR (ELECTRODE)
CABLE HIGH FREQUENCY MONO STRZ (ELECTRODE) IMPLANT
CANISTER SUCT 3000ML PPV (MISCELLANEOUS) ×4 IMPLANT
CLOSURE WOUND 1/2 X4 (GAUZE/BANDAGES/DRESSINGS) ×1
CLOTH BEACON ORANGE TIMEOUT ST (SAFETY) ×4 IMPLANT
CONTAINER PREFILL 10% NBF 60ML (FORM) ×8 IMPLANT
DILATOR CANAL MILEX (MISCELLANEOUS) IMPLANT
DRSG OPSITE POSTOP 3X4 (GAUZE/BANDAGES/DRESSINGS) IMPLANT
DURAPREP 26ML APPLICATOR (WOUND CARE) ×4 IMPLANT
ELECT REM PT RETURN 9FT ADLT (ELECTROSURGICAL)
ELECTRODE REM PT RTRN 9FT ADLT (ELECTROSURGICAL) IMPLANT
GLOVE BIO SURGEON STRL SZ 6.5 (GLOVE) ×6 IMPLANT
GLOVE BIO SURGEONS STRL SZ 6.5 (GLOVE) ×2
GLOVE BIOGEL PI IND STRL 7.0 (GLOVE) ×4 IMPLANT
GLOVE BIOGEL PI INDICATOR 7.0 (GLOVE) ×4
GOWN STRL REUS W/TWL LRG LVL3 (GOWN DISPOSABLE) ×8 IMPLANT
LOOP CUTTING BIPOLAR 21FR (ELECTRODE) IMPLANT
NDL SAFETY ECLIPSE 18X1.5 (NEEDLE) ×2 IMPLANT
NEEDLE HYPO 18GX1.5 SHARP (NEEDLE) ×2
NEEDLE INSUFFLATION 120MM (ENDOMECHANICALS) ×4 IMPLANT
NS IRRIG 1000ML POUR BTL (IV SOLUTION) ×4 IMPLANT
PACK LAPAROSCOPY BASIN (CUSTOM PROCEDURE TRAY) ×4 IMPLANT
PACK TRENDGUARD 450 HYBRID PRO (MISCELLANEOUS) ×2 IMPLANT
PACK TRENDGUARD 600 HYBRD PROC (MISCELLANEOUS) IMPLANT
PACK VAGINAL MINOR WOMEN LF (CUSTOM PROCEDURE TRAY) ×4 IMPLANT
PAD OB MATERNITY 4.3X12.25 (PERSONAL CARE ITEMS) ×4 IMPLANT
POUCH SPECIMEN RETRIEVAL 10MM (ENDOMECHANICALS) IMPLANT
PROTECTOR NERVE ULNAR (MISCELLANEOUS) ×4 IMPLANT
SCISSORS MNPLR CVD DVNC (INSTRUMENTS) ×2 IMPLANT
SCISSORS MONOPOLAR CVD (INSTRUMENTS) ×2
SET IRRIG TUBING LAPAROSCOPIC (IRRIGATION / IRRIGATOR) IMPLANT
SHEARS HARMONIC ACE PLUS 36CM (ENDOMECHANICALS) IMPLANT
SLEEVE XCEL OPT CAN 5 100 (ENDOMECHANICALS) IMPLANT
STRIP CLOSURE SKIN 1/2X4 (GAUZE/BANDAGES/DRESSINGS) ×3 IMPLANT
SUT VICRYL 0 UR6 27IN ABS (SUTURE) IMPLANT
SUT VICRYL 4-0 PS2 18IN ABS (SUTURE) ×4 IMPLANT
TOWEL OR 17X24 6PK STRL BLUE (TOWEL DISPOSABLE) ×8 IMPLANT
TRENDGUARD 450 HYBRID PRO PACK (MISCELLANEOUS) ×4
TRENDGUARD 600 HYBRID PROC PK (MISCELLANEOUS)
TROCAR OPTI TIP 5M 100M (ENDOMECHANICALS) ×4 IMPLANT
TROCAR XCEL NON-BLD 11X100MML (ENDOMECHANICALS) IMPLANT
TUBING AQUILEX INFLOW (TUBING) ×4 IMPLANT
TUBING AQUILEX OUTFLOW (TUBING) ×4 IMPLANT
WARMER LAPAROSCOPE (MISCELLANEOUS) ×4 IMPLANT

## 2017-04-23 NOTE — Transfer of Care (Signed)
Immediate Anesthesia Transfer of Care Note  Patient: Natalie Ellis  Procedure(s) Performed: Procedure(s) with comments: LAPAROSCOPY DIAGNOSTIC (N/A) - removal of endometriosis  DILATATION AND CURETTAGE /HYSTEROSCOPY (N/A)  Patient Location: PACU  Anesthesia Type:General  Level of Consciousness: awake, alert  and oriented  Airway & Oxygen Therapy: Patient Spontanous Breathing and Patient connected to nasal cannula oxygen  Post-op Assessment: Report given to RN and Post -op Vital signs reviewed and stable  Post vital signs: Reviewed and stable  Last Vitals:  Vitals:   04/23/17 0851  BP: 120/82  Pulse: 62  Resp: 16  Temp: 36.7 C    Last Pain:  Vitals:   04/23/17 0851  TempSrc: Oral  PainSc: 2       Patients Stated Pain Goal: 5 (04/23/17 0851)  Complications: No apparent anesthesia complications

## 2017-04-23 NOTE — Op Note (Signed)
04/23/2017  1:55 PM  PATIENT:  Natalie Ellis  26 y.o. female  PRE-OPERATIVE DIAGNOSIS:  Pelvic Pain, DUB DUB  POST-OPERATIVE DIAGNOSIS:  Same + stage 2 endometriosis, bilateral hydrosalpinges  PROCEDURE:  Procedure(s) with comments: LAPAROSCOPY DIAGNOSTIC (N/A) - removal of endometriosis  DILATATION AND CURETTAGE /HYSTEROSCOPY (N/A)  Cautery of endometriosis  SURGEON:  Surgeon(s) and Role:    * Jakorian Marengo C, MD - Primary  FINDINGS: Extensive endometriosis implants throughout the cul de sac, normal upper abdomen, bilateral salpinges, normal appearing ovariea.  ANESTHESIA:   general  EBL:  Total I/O In: 1300 [I.V.:1300] Out: 110 [Urine:100; Blood:10]  BLOOD ADMINISTERED:none  DRAINS: none   LOCAL MEDICATIONS USED:  MARCAINE     SPECIMEN:  Source of Specimen:  cul de sac peritoneum  DISPOSITION OF SPECIMEN:  PATHOLOGY  COUNTS:  YES  TOURNIQUET:  * No tourniquets in log *  DICTATION: .Dragon Dictation  PLAN OF CARE: Discharge to home after PACU  PATIENT DISPOSITION:  PACU - hemodynamically stable.   Delay start of Pharmacological VTE agent (>24hrs) due to surgical blood loss or risk of bleeding: not applicable  The risks, benefits, and alternatives of surgery were explained, understood, accepted. In the operating room she was placed in the dorsal lithotomy position, and general anesthesia was given without complication. Her abdomen and vagina were prepped and draped in the usual sterile fashion. A timeout procedure was done. A bimanual exam revealed a small retroverted and mobile uterus. Her adnexa felt normal. A Hulka manipulator was placed. Her bladder was emptied with a Robinson catheter. Gloves were changed, and attention was turned to the abdomen. Approximately the 5 mL of 0.5% Marcaine was injected into the umbilicus. A vertical incision was made at the site. A varies needle was placed intraperitoneally. Low-flow CO2 was used to insufflate the abdomen to  approximately 3-1/2 L. Once a good pneumoperitoneum was established, a 5 mm Excel trocar was placed. Laparoscopy confirmed correct placement. A 5 mm port was placed in the left lower quadrant under direct laparoscopic visualization after injecting 0.5% Marcaine in the incision sites. Herupper abdomen appeared normal. The pelvis was inspected in an alpha manner. The right ovary was tucked beside the uterus, there were dense endometriosis implants on the right uterosacral ligament. The posterior cul de sac had many chocolate cyst- like endometriosis implants. These were excised using the biopsy forceps. The left ovary appeared normal and free. Both oviducts were enlarged c/w hydrosalpinges. Both fimbria were agglutinated to some degree. A monopolar needle tip Nezhat was used to cauterize the endometriosis implants that were well away from the ureters. The top of the fundus on the had endometriosis as well. I cauterized that area as well. The anterior cul de sac was free of disease. Hemostasis was noted.I removed the 5 mm ports and noted hemostasis.  No defects were palpable. A subcuticular closure was done with 4-0 Vicryl suture at all incision sites. A Steri-Strip was placed across each incision. She was extubated and taken to the recovery room in stable condition.

## 2017-04-23 NOTE — Anesthesia Preprocedure Evaluation (Addendum)
Anesthesia Evaluation  Patient identified by MRN, date of birth, ID band Patient awake    Reviewed: Allergy & Precautions, NPO status , Patient's Chart, lab work & pertinent test results  Airway Mallampati: I  TM Distance: >3 FB Neck ROM: Full    Dental  (+) Teeth Intact, Dental Advisory Given   Pulmonary Current Smoker,    breath sounds clear to auscultation       Cardiovascular negative cardio ROS   Rhythm:Regular Rate:Normal     Neuro/Psych negative neurological ROS  negative psych ROS   GI/Hepatic negative GI ROS, Neg liver ROS,   Endo/Other  negative endocrine ROS  Renal/GU negative Renal ROS  negative genitourinary   Musculoskeletal negative musculoskeletal ROS (+)   Abdominal Normal abdominal exam  (+)   Peds negative pediatric ROS (+)  Hematology negative hematology ROS (+)   Anesthesia Other Findings   Reproductive/Obstetrics negative OB ROS                            Anesthesia Physical Anesthesia Plan  ASA: II  Anesthesia Plan: General   Post-op Pain Management:    Induction: Intravenous  PONV Risk Score and Plan: 3 and Ondansetron, Dexamethasone, Propofol, Midazolam and Scopolamine patch - Pre-op  Airway Management Planned: Oral ETT  Additional Equipment:   Intra-op Plan:   Post-operative Plan: Extubation in OR  Informed Consent: I have reviewed the patients History and Physical, chart, labs and discussed the procedure including the risks, benefits and alternatives for the proposed anesthesia with the patient or authorized representative who has indicated his/her understanding and acceptance.     Plan Discussed with: CRNA  Anesthesia Plan Comments:         Anesthesia Quick Evaluation

## 2017-04-23 NOTE — Anesthesia Postprocedure Evaluation (Signed)
Anesthesia Post Note  Patient: Vertell LimberChelsea D Grawe  Procedure(s) Performed: Procedure(s) (LRB): LAPAROSCOPY DIAGNOSTIC (N/A) DILATATION AND CURETTAGE /HYSTEROSCOPY (N/A)     Patient location during evaluation: PACU Anesthesia Type: General Level of consciousness: awake Pain management: pain level controlled Vital Signs Assessment: post-procedure vital signs reviewed and stable Respiratory status: spontaneous breathing Cardiovascular status: stable Postop Assessment: no signs of nausea or vomiting Anesthetic complications: no    Last Vitals:  Vitals:   04/23/17 1515 04/23/17 1526  BP: 123/83   Pulse: 62 62  Resp: (!) 22 19  Temp:  37.1 C    Last Pain:  Vitals:   04/23/17 1526  TempSrc: Oral  PainSc:    Pain Goal: Patients Stated Pain Goal: 5 (04/23/17 0851)               Retal Tonkinson JR,JOHN Susann GivensFRANKLIN

## 2017-04-23 NOTE — Discharge Instructions (Signed)
DISCHARGE INSTRUCTIONS: Laparoscopy ° °The following instructions have been prepared to help you care for yourself upon your return home today. ° °Wound care: °• Do not get the incision wet for the first 24 hours. The incision should be kept clean and dry. °• The Band-Aids or dressings may be removed the day after surgery. °• Should the incision become sore, red, and swollen after the first week, check with your doctor. ° °Personal hygiene: °• Shower the day after your procedure. ° °Activity and limitations: °• Do NOT drive or operate any equipment today. °• Do NOT lift anything more than 15 pounds for 2-3 weeks after surgery. °• Do NOT rest in bed all day. °• Walking is encouraged. Walk each day, starting slowly with 5-minute walks 3 or 4 times a day. Slowly increase the length of your walks. °• Walk up and down stairs slowly. °• Do NOT do strenuous activities, such as golfing, playing tennis, bowling, running, biking, weight lifting, gardening, mowing, or vacuuming for 2-4 weeks. Ask your doctor when it is okay to start. ° °Diet: Eat a light meal as desired this evening. You may resume your usual diet tomorrow. ° °Return to work: This is dependent on the type of work you do. For the most part you can return to a desk job within a week of surgery. If you are more active at work, please discuss this with your doctor. ° °What to expect after your surgery: You may have a slight burning sensation when you urinate on the first day. You may have a very small amount of blood in the urine. Expect to have a small amount of vaginal discharge/light bleeding for 1-2 weeks. It is not unusual to have abdominal soreness and bruising for up to 2 weeks. You may be tired and need more rest for about 1 week. You may experience shoulder pain for 24-72 hours. Lying flat in bed may relieve it. ° °Call your doctor for any of the following: °• Develop a fever of 100.4 or greater °• Inability to urinate 6 hours after discharge from  hospital °• Severe pain not relieved by pain medications °• Persistent of heavy bleeding at incision site °• Redness or swelling around incision site after a week °• Increasing nausea or vomiting ° °Patient Signature________________________________________ °Nurse Signature_________________________________________Dilation and Curettage or Vacuum Curettage, Care After °This sheet gives you information about how to care for yourself after your procedure. Your health care provider may also give you more specific instructions. If you have problems or questions, contact your health care provider. °What can I expect after the procedure? °After your procedure, it is common to have: °· Mild pain or cramping. °· Some vaginal bleeding or spotting. ° °These may last for up to 2 weeks after your procedure. °Follow these instructions at home: °Activity ° °· Do not drive or use heavy machinery while taking prescription pain medicine. °· Avoid driving for the first 24 hours after your procedure. °· Take frequent, short walks, followed by rest periods, throughout the day. Ask your health care provider what activities are safe for you. After 1-2 days, you may be able to return to your normal activities. °· Do not lift anything heavier than 10 lb (4.5 kg) until your health care provider approves. °· For at least 2 weeks, or as long as told by your health care provider, do not: °? Douche. °? Use tampons. °? Have sexual intercourse. °General instructions ° °· Take over-the-counter and prescription medicines only as told by your   health care provider. This is especially important if you take blood thinning medicine. °· Do not take baths, swim, or use a hot tub until your health care provider approves. Take showers instead of baths. °· Wear compression stockings as told by your health care provider. These stockings help to prevent blood clots and reduce swelling in your legs. °· It is your responsibility to get the results of your  procedure. Ask your health care provider, or the department performing the procedure, when your results will be ready. °· Keep all follow-up visits as told by your health care provider. This is important. °Contact a health care provider if: °· You have severe cramps that get worse or that do not get better with medicine. °· You have severe abdominal pain. °· You cannot drink fluids without vomiting. °· You develop pain in a different area of your pelvis. °· You have bad-smelling vaginal discharge. °· You have a rash. °Get help right away if: °· You have vaginal bleeding that soaks more than one sanitary pad in 1 hour, for 2 hours in a row. °· You pass large blood clots from your vagina. °· You have a fever that is above 100.4°F (38.0°C). °· Your abdomen feels very tender or hard. °· You have chest pain. °· You have shortness of breath. °· You cough up blood. °· You feel dizzy or light-headed. °· You faint. °· You have pain in your neck or shoulder area. °This information is not intended to replace advice given to you by your health care provider. Make sure you discuss any questions you have with your health care provider. °Document Released: 10/17/2000 Document Revised: 06/18/2016 Document Reviewed: 05/22/2016 °Elsevier Interactive Patient Education © 2018 [employer] ° ° °Diagnostic Laparoscopy, Care After °Refer to this sheet in the next few weeks. These instructions provide you with information about caring for yourself after your procedure. Your health care provider may also give you more specific instructions. Your treatment has been planned according to current medical practices, but problems sometimes occur. Call your health care provider if you have any problems or questions after your procedure. °What can I expect after the procedure? °After your procedure, it is common to have mild discomfort in the throat and abdomen. °Follow these instructions at home: °· Take over-the-counter and prescription  medicines only as told by your health care provider. °· Do not drive for 24 hours if you received a sedative. °· Return to your normal activities as told by your health care provider. °· Do not take baths, swim, or use a hot tub until your health care provider approves. You may shower. °· Follow instructions from your health care provider about how to take care of your incision. Make sure you: °? Wash your hands with soap and water before you change your bandage (dressing). If soap and water are not available, use hand sanitizer. °? Change your dressing as told by your health care provider. °? Leave stitches (sutures), skin glue, or adhesive strips in place. These skin closures may need to stay in place for 2 weeks or longer. If adhesive strip edges start to loosen and curl up, you may trim the loose edges. Do not remove adhesive strips completely unless your health care provider tells you to do that. °· Check your incision area every day for signs of infection. Check for: °? More redness, swelling, or pain. °? More fluid or blood. °? Warmth. °? Pus or a bad smell. °· It is your responsibility to get   the results of your procedure. Ask your health care provider or the department performing the procedure when your results will be ready. °Contact a health care provider if: °· There is new pain in your shoulders. °· You feel light-headed or faint. °· You are unable to pass gas or unable to have a bowel movement. °· You feel nauseous or you vomit. °· You develop a rash. °· You have more redness, swelling, or pain around your incision. °· You have more fluid or blood coming from your incision. °· Your incision feels warm to the touch. °· You have pus or a bad smell coming from your incision. °· You have a fever or chills. °Get help right away if: °· Your pain is getting worse. °· You have ongoing vomiting. °· The edges of your incision open up. °· You have trouble breathing. °· You have chest pain. °This information is  not intended to replace advice given to you by your health care provider. Make sure you discuss any questions you have with your health care provider. °Document Released: 10/01/2015 Document Revised: 03/27/2016 Document Reviewed: 07/03/2015 °Elsevier Interactive Patient Education © 2018 Elsevier Inc. ° °

## 2017-04-23 NOTE — Anesthesia Procedure Notes (Signed)
Procedure Name: Intubation Date/Time: 04/23/2017 12:19 PM Performed by: Jonna Munro Pre-anesthesia Checklist: Patient identified, Emergency Drugs available, Suction available, Patient being monitored and Timeout performed Patient Re-evaluated:Patient Re-evaluated prior to inductionOxygen Delivery Method: Circle system utilized Preoxygenation: Pre-oxygenation with 100% oxygen Intubation Type: IV induction Ventilation: Mask ventilation without difficulty Laryngoscope Size: Mac and 3 Grade View: Grade I Tube type: Oral Tube size: 7.0 mm Number of attempts: 1 Airway Equipment and Method: Stylet Secured at: 21 cm Tube secured with: Tape Dental Injury: Teeth and Oropharynx as per pre-operative assessment

## 2017-04-23 NOTE — H&P (Signed)
Natalie LimberChelsea D Leeds is an 26 y.o. female SW G0 here as a referral from [facility] for evaluation of long standing CPP, severe dysmenorrhea as well as DUB. She has now taken 2 months of continuous OCPs, is still bleeding and her pain is not improved. [facility] a possible intrauterine polyp as well as possible endometriomas.    Patient's last menstrual period was 03/24/2017 (exact date).    Past Medical History:  Diagnosis Date  . Seasonal allergies     Past Surgical History:  Procedure Laterality Date  . ESOPHAGOGASTRODUODENOSCOPY ENDOSCOPY     normal  . TONSILLECTOMY    . WISDOM TOOTH EXTRACTION  2017    History reviewed. No pertinent family history.  Social History:  reports that she has been smoking Cigarettes.  She has a 0.12 pack-year smoking history. She has never used smokeless tobacco. She reports that she drinks alcohol. She reports that she does not use drugs.  Allergies:  Allergies  Allergen Reactions  . Other     Walnuts and pecans, itchy throat    Prescriptions Prior to Admission  Medication Sig Dispense Refill Last Dose  . ibuprofen (ADVIL,MOTRIN) 200 MG tablet Take 600-800 mg by mouth every 6 (six) hours as needed (pain).   Past Week at Unknown time  . levonorgestrel-ethinyl estradiol (AVIANE,ALESSE,LESSINA) 0.1-20 MG-MCG tablet Take 1 tablet by mouth daily.   04/22/2017 at Unknown time    ROS  Blood pressure 120/82, pulse 62, temperature 98 F (36.7 C), temperature source Oral, resp. rate 16, last menstrual period 03/24/2017, SpO2 100 %. Physical Exam  Heart- rrr Lungs- CTAB Abd- benign  Results for orders placed or performed during the hospital encounter of 04/23/17 (from the past 24 hour(s))  Pregnancy, urine     Status: None   Collection Time: 04/23/17  8:45 AM  Result Value Ref Range   Preg Test, Ur NEGATIVE NEGATIVE    No results found.  Assessment/Plan: Chronic pelvic pain- Plan for diagnostic laparoscopy today DUB-  plan for d&c and hysteroscopy today  She understands the risks of surgery, including, but not to infection, bleeding, DVTs, damage to bowel, bladder, ureters. She wishes to proceed.     Allie BossierMyra C Chaley Castellanos 04/23/2017, 11:28 AM

## 2017-04-24 ENCOUNTER — Encounter (HOSPITAL_COMMUNITY): Payer: Self-pay | Admitting: Obstetrics & Gynecology

## 2017-05-11 ENCOUNTER — Telehealth: Payer: Self-pay | Admitting: *Deleted

## 2017-05-11 NOTE — Telephone Encounter (Signed)
Natalie Ellis called 05/08/17 pm and left a message she is sorry she is calling the nurse line but has been calling to make an appointment 3-4 times and no one is answering. States she had surgery and needs to make an appointment. Per chart on 04/23/17 had Dx Lap, D&C with Dr. Marice Potterove. Will forward to registars to call patient with post op appt.

## 2017-05-19 ENCOUNTER — Ambulatory Visit: Payer: Medicaid Other | Admitting: Obstetrics & Gynecology

## 2017-05-20 ENCOUNTER — Encounter: Payer: Self-pay | Admitting: Obstetrics & Gynecology

## 2017-05-20 ENCOUNTER — Ambulatory Visit: Payer: Self-pay | Admitting: Obstetrics & Gynecology

## 2017-05-20 VITALS — BP 135/80 | HR 78 | Wt 161.6 lb

## 2017-05-20 DIAGNOSIS — Z9889 Other specified postprocedural states: Secondary | ICD-10-CM

## 2017-05-20 NOTE — Progress Notes (Signed)
   Subjective:    Patient ID: Natalie Ellis, female    DOB: [birth year], 26 y.o.   MRN: [id number]  HPI 26 yo S W G0 here for a post op follow up. She had a diagnostic laparoscopy for CPP. I saw Stage 2 endometriosis. I excised what I could safely. Pathology confirmed endometriosis. I also did a d&c for DUB. A polyp was found. She is taking her OCPs continuously and has had a much lighter and less painful.    Review of Systems She is gay.    Objective:   Physical Exam [id number] Breathing, conversing, and ambulating normally Incisions healed well     Assessment & Plan:   Post op doing well Continue continuous OCPs We discussed her BL hydrosalpinges and her risk for future ectopic pregnancy She will schedule an annual in the next few months.

## 2017-06-02 ENCOUNTER — Telehealth: Payer: Self-pay | Admitting: *Deleted

## 2017-06-02 MED ORDER — LEVONORGESTREL-ETHINYL ESTRAD 0.1-20 MG-MCG PO TABS: 1.0000 | ORAL_TABLET | Freq: Every day | ORAL | 0 refills | Status: DC

## 2017-06-02 NOTE — Telephone Encounter (Signed)
Pt left message stating that she saw Dr. Marice Potterove recently and thought her birth control was going to be refilled for 1 year. Her pharmacy has not received the refill order. Per chart review, pt has scheduled appt on 8/15 for annual gyn exam. I sent 1 refill to pharmacy and pt can receive the full year of refills @ appt on 8/15. Pt was notified and voiced understanding.

## 2017-06-03 ENCOUNTER — Telehealth: Payer: Self-pay | Admitting: *Deleted

## 2017-06-03 DIAGNOSIS — Z3041 Encounter for surveillance of contraceptive pills: Secondary | ICD-10-CM

## 2017-06-03 MED ORDER — LEVONORGESTREL-ETHINYL ESTRAD 0.1-20 MG-MCG PO TABS: 1.0000 | ORAL_TABLET | Freq: Every day | ORAL | 11 refills | Status: DC

## 2017-06-03 NOTE — Telephone Encounter (Signed)
Pt states that she spoke with a nurse yesterday and is still having problems getting her birth control rx. Requesting a call back to discuss.

## 2017-06-17 ENCOUNTER — Encounter: Payer: Self-pay | Admitting: Obstetrics & Gynecology

## 2017-06-17 ENCOUNTER — Ambulatory Visit (INDEPENDENT_AMBULATORY_CARE_PROVIDER_SITE_OTHER): Payer: Medicaid Other | Admitting: Obstetrics & Gynecology

## 2017-06-17 ENCOUNTER — Other Ambulatory Visit (HOSPITAL_COMMUNITY)
Admission: RE | Admit: 2017-06-17 | Discharge: 2017-06-17 | Disposition: A | Discharge status: Home / Self Care | Payer: Medicaid Other | Source: Ambulatory Visit | Attending: Obstetrics & Gynecology | Admitting: Obstetrics & Gynecology

## 2017-06-17 VITALS — BP 127/78 | HR 91

## 2017-06-17 DIAGNOSIS — N809 Endometriosis, unspecified: Secondary | ICD-10-CM | POA: Insufficient documentation

## 2017-06-17 DIAGNOSIS — Z01419 Encounter for gynecological examination (general) (routine) without abnormal findings: Secondary | ICD-10-CM | POA: Insufficient documentation

## 2017-06-17 DIAGNOSIS — Z3049 Encounter for surveillance of other contraceptives: Secondary | ICD-10-CM

## 2017-06-17 NOTE — Progress Notes (Signed)
Subjective:    Natalie Ellis D Ellis is a 26 y.o. SW G0  female who presents for an annual exam. The patient has no complaints today. She reports that she hasn't had a period since starting continuous OCPs. No pain. The patient is sexually active. GYN screening history: no prior history of gyn screening tests. The patient wears seatbelts: yes. The patient participates in regular exercise: no. Has the patient ever been transfused or tattooed?: no. The patient reports that there is not domestic violence in her life.   Menstrual History: OB History    No data available      Menarche age: [age] No LMP recorded.    The following portions of the patient's history were reviewed and updated as appropriate: allergies, current medications, past family history, past medical history, past social history, past surgical history and problem list.  Review of Systems Pertinent items are noted in HPI.   FH- no breast cancer, + ovarian cancer in m great GM, no colon cancer Works at [employer] Monogamous for 7 months Dyspareunia RESOLVED after surgery and OCPs Negative ct/gc 04/2017   Objective:    BP 127/78   Pulse 91   General Appearance:    Alert, cooperative, no distress, appears stated age  Head:    Normocephalic, without obvious abnormality, atraumatic  Eyes:    PERRL, conjunctiva/corneas clear, EOM's intact, fundi    benign, both eyes  Ears:    Normal TM's and external ear canals, both ears  Nose:   Nares normal, septum midline, mucosa normal, no drainage    or sinus tenderness, pierced septum  Throat:   Lips, mucosa, and tongue normal; teeth and gums normal  Neck:   Supple, symmetrical, trachea midline, no adenopathy;    thyroid:  no enlargement/tenderness/nodules; no carotid   bruit or JVD  Back:     Symmetric, no curvature, ROM normal, no CVA tenderness  Lungs:     Clear to auscultation bilaterally, respirations unlabored  Chest Wall:    No tenderness or deformity   Heart:    Regular rate and  rhythm, S1 and S2 normal, no murmur, rub   or gallop  Breast Exam:    No tenderness, masses, or nipple abnormality, nipples pierced  Abdomen:     Soft, non-tender, bowel sounds active all four quadrants,    no masses, no organomegaly  Genitalia:    Normal female without lesion, discharge or tenderness,     Extremities:   Extremities normal, atraumatic, no cyanosis or edema  Pulses:   2+ and symmetric all extremities  Skin:   Skin color, texture, turgor normal, no rashes or lesions  Lymph nodes:   Cervical, supraclavicular, and axillary nodes normal  Neurologic:   CNII-XII intact, normal strength, sensation and reflexes    throughout  .    Assessment:    Healthy female exam.    Plan:     Thin prep Pap smear.   She may have OCPs for 3 years without another visit.

## 2017-06-18 MED ORDER — LEVONORGESTREL-ETHINYL ESTRAD 0.1-20 MG-MCG PO TABS: 1.0000 | ORAL_TABLET | Freq: Every day | ORAL | 6 refills | Status: DC

## 2017-06-18 MED ORDER — LEVONORGEST-ETH EST & ETH EST 42-21-21-7 DAYS PO TABS: 1.0000 | ORAL_TABLET | Freq: Every day | ORAL | 11 refills | Status: DC

## 2017-06-18 NOTE — Addendum Note (Signed)
Addended by: Garret ReddishBARNES, Tray Klayman M on: 06/18/2017 01:25 PM   Modules accepted: Orders

## 2017-06-18 NOTE — Addendum Note (Signed)
Addended by: Garret ReddishBARNES, Lorrain Rivers M on: 06/18/2017 01:35 PM   Modules accepted: Orders

## 2017-06-19 LAB — CYTOLOGY - PAP: DIAGNOSIS: NEGATIVE

## 2018-03-01 ENCOUNTER — Telehealth: Payer: Self-pay | Admitting: General Practice

## 2018-03-01 DIAGNOSIS — Z01419 Encounter for gynecological examination (general) (routine) without abnormal findings: Secondary | ICD-10-CM

## 2018-03-01 MED ORDER — LEVONORGESTREL-ETHINYL ESTRAD 0.1-20 MG-MCG PO TABS: 1.0000 | ORAL_TABLET | Freq: Every day | ORAL | 6 refills | Status: DC

## 2018-03-01 NOTE — Telephone Encounter (Signed)
Patient called and left message on nurse voicemail line stating she needs a Rx for birth control pills. Patient states Dr Marice Potter told her she could have 3 years worth of refills before needing to come in again. Refill sent in per Dr Ellin Saba last visit note. Called patient, no answer- left message stating we are trying to reach you to return your phone call. We have sent your refills in. You may call us back if you have questions.

## 2018-04-18 ENCOUNTER — Emergency Department (HOSPITAL_BASED_OUTPATIENT_CLINIC_OR_DEPARTMENT_OTHER)
Admission: EM | Admit: 2018-04-18 | Discharge: 2018-04-18 | Disposition: A | Discharge status: Home / Self Care | Payer: Medicaid Other | Attending: Emergency Medicine | Admitting: Emergency Medicine

## 2018-04-18 ENCOUNTER — Emergency Department (HOSPITAL_BASED_OUTPATIENT_CLINIC_OR_DEPARTMENT_OTHER): Payer: Medicaid Other

## 2018-04-18 ENCOUNTER — Encounter (HOSPITAL_BASED_OUTPATIENT_CLINIC_OR_DEPARTMENT_OTHER): Payer: Self-pay | Admitting: Emergency Medicine

## 2018-04-18 ENCOUNTER — Other Ambulatory Visit: Payer: Self-pay

## 2018-04-18 DIAGNOSIS — Y998 Other external cause status: Secondary | ICD-10-CM | POA: Insufficient documentation

## 2018-04-18 DIAGNOSIS — Y93K3 Activity, grooming and shearing an animal: Secondary | ICD-10-CM | POA: Insufficient documentation

## 2018-04-18 DIAGNOSIS — Y92012 Bathroom of single-family (private) house as the place of occurrence of the external cause: Secondary | ICD-10-CM | POA: Insufficient documentation

## 2018-04-18 DIAGNOSIS — F17210 Nicotine dependence, cigarettes, uncomplicated: Secondary | ICD-10-CM | POA: Insufficient documentation

## 2018-04-18 DIAGNOSIS — R202 Paresthesia of skin: Secondary | ICD-10-CM | POA: Insufficient documentation

## 2018-04-18 DIAGNOSIS — S61451A Open bite of right hand, initial encounter: Secondary | ICD-10-CM | POA: Insufficient documentation

## 2018-04-18 DIAGNOSIS — W540XXA Bitten by dog, initial encounter: Secondary | ICD-10-CM | POA: Insufficient documentation

## 2018-04-18 DIAGNOSIS — Z23 Encounter for immunization: Secondary | ICD-10-CM | POA: Insufficient documentation

## 2018-04-18 DIAGNOSIS — R200 Anesthesia of skin: Secondary | ICD-10-CM | POA: Insufficient documentation

## 2018-04-18 MED ORDER — IBUPROFEN 400 MG PO TABS
600.0000 mg | ORAL_TABLET | Freq: Once | ORAL | Status: AC
  Administered 2018-04-18: 600 mg via ORAL
  Filled 2018-04-18: qty 1

## 2018-04-18 MED ORDER — AMOXICILLIN-POT CLAVULANATE 875-125 MG PO TABS: 1.0000 | ORAL_TABLET | Freq: Two times a day (BID) | ORAL | 0 refills | Status: DC

## 2018-04-18 MED ORDER — TETANUS-DIPHTH-ACELL PERTUSSIS 5-2.5-18.5 LF-MCG/0.5 IM SUSP
0.5000 mL | Freq: Once | INTRAMUSCULAR | Status: AC
  Administered 2018-04-18: 0.5 mL via INTRAMUSCULAR
  Filled 2018-04-18: qty 0.5

## 2018-04-18 MED ORDER — AMOXICILLIN-POT CLAVULANATE 875-125 MG PO TABS
1.0000 | ORAL_TABLET | Freq: Once | ORAL | Status: AC
  Administered 2018-04-18: 1 via ORAL
  Filled 2018-04-18: qty 1

## 2018-04-18 NOTE — Discharge Instructions (Signed)
Be sure to keep the area clean with soap and water.  It is good and okay if it keeps draining or bleeding.  Do not attempt to close the wound with a Band-Aid or other closing material.  If at any point your pain worsens, you develop redness, overall fever or heat to the wound, or drainage, then return to the ER for evaluation.  Follow-up with a primary care physician for wound check otherwise.

## 2018-04-18 NOTE — ED Triage Notes (Addendum)
Patient was bit by her dog earlier - has a puncture wound to her right hand  - patient states that her dogs shots are up to date  - patient states that she has numbness to her right thumb

## 2018-04-18 NOTE — ED Notes (Signed)
Wound dressed and bacitracin applied. Pt given d/c instructions as per chart. Rx x 1. Verbalizes understanding. No questions.

## 2018-04-18 NOTE — ED Provider Notes (Signed)
[facility] EMERGENCY DEPARTMENT Provider Note   CSN: [id number] Arrival date & time: 04/18/18  2020     History   Chief Complaint Chief Complaint  Patient presents with  . Animal Bite    HPI Natalie Ellis is a 27 y.o. female.  HPI  27 year old female presents after a dog bite.  Her own dog bit her tonight about 7 PM.  The dog tends to bite whenever it is scared and she was picking it up out of it straight to put it in the bath.  Bit her on the right hand.  She states that she initially cleaned it with some water but do not scrub or irrigated.  She is unsure when her last tetanus shot was and does not know if she completed shots as a child.  The dog's shots are up-to-date.  She feels some tingling/numbness to her right thumb near the bite but when actually palpating the hand can feel everything.  There has been swelling and pain which she describes as a moderate 5/10 pain.  Past Medical History:  Diagnosis Date  . Seasonal allergies     Patient Active Problem List   Diagnosis Date Noted  . Endometriosis determined by laparoscopy 06/17/2017    Past Surgical History:  Procedure Laterality Date  . ESOPHAGOGASTRODUODENOSCOPY ENDOSCOPY     normal  . HYSTEROSCOPY W/D&C N/A 04/23/2017   Procedure: DILATATION AND CURETTAGE /HYSTEROSCOPY;  Surgeon: Allie Bossierove, Myra C, MD;  Location: [facility];  Service: Gynecology;  Laterality: N/A;  . LAPAROSCOPY N/A 04/23/2017   Procedure: LAPAROSCOPY DIAGNOSTIC;  Surgeon: Allie Bossierove, Myra C, MD;  Location: [facility];  Service: Gynecology;  Laterality: N/A;  removal of endometriosis   . TONSILLECTOMY    . WISDOM TOOTH EXTRACTION  2017     OB History   None      Home Medications    Prior to Admission medications   Medication Sig Start Date End Date Taking? Authorizing Provider  amoxicillin-clavulanate (AUGMENTIN) 875-125 MG tablet Take 1 tablet by mouth 2 (two) times daily. One po bid x 7 days 04/19/18   Pricilla LovelessGoldston, Raevyn Sokol, MD    levonorgestrel-ethinyl estradiol (AVIANE,ALESSE,LESSINA) 0.1-20 MG-MCG tablet Take 1 tablet by mouth daily. Take one active tablet daily, NO PLACEBOS 03/01/18   Allie Bossierove, Myra C, MD    Family History History reviewed. No pertinent family history.  Social History Social History   Tobacco Use  . Smoking status: Current Every Day Smoker    Packs/day: 0.25    Years: 0.50    Pack years: 0.12    Types: Cigarettes  . Smokeless tobacco: Never Used  Substance Use Topics  . Alcohol use: Yes    Comment: socially  . Drug use: No     Allergies   Other   Review of Systems Review of Systems  Musculoskeletal: Positive for arthralgias and joint swelling.  Skin: Positive for wound.  Neurological: Positive for numbness. Negative for weakness.     Physical Exam Updated Vital Signs BP 117/84 (BP Location: Left Arm)   Pulse 64   Temp 98.6 F (37 C) (Oral)   Resp 18   Ht 5\' 6"  (1.676 m)   Wt 77.1 kg (170 lb)   SpO2 98%   BMI 27.44 kg/m   Physical Exam  Constitutional: She appears well-developed and well-nourished. No distress.  HENT:  Head: Normocephalic and atraumatic.  Nose: Nose normal.  Eyes: Right eye exhibits no discharge. Left eye exhibits no discharge.  Cardiovascular: Normal rate and  regular rhythm.  Pulses:      Radial pulses are 2+ on the right side.  Pulmonary/Chest: Effort normal.  Musculoskeletal:       Right wrist: She exhibits no tenderness.       Right hand: She exhibits tenderness, laceration and swelling. Normal sensation noted. Normal strength noted.       Hands: Multiple small puncture wounds that do not actually break the skin around the thenar hand  Neurological: She is alert.  Skin: Skin is warm and dry. She is not diaphoretic.  Nursing note and vitals reviewed.    ED Treatments / Results  Labs (all labs ordered are listed, but only abnormal results are displayed) Labs Reviewed - No data to display  EKG None  Radiology Dg Hand Complete  Right  Result Date: 04/18/2018 CLINICAL DATA:  Dog bite to the thenar eminence EXAM: RIGHT HAND - COMPLETE 3+ VIEW COMPARISON:  None. FINDINGS: There is no evidence of fracture or dislocation. There is no evidence of arthropathy or other focal bone abnormality. Soft tissues are unremarkable. IMPRESSION: Negative. Electronically Signed   By: Jasmine Pang M.D.   On: 04/18/2018 22:34    Procedures Procedures (including critical care time)  Medications Ordered in ED Medications  ibuprofen (ADVIL,MOTRIN) tablet 600 mg (600 mg Oral Given 04/18/18 2215)  amoxicillin-clavulanate (AUGMENTIN) 875-125 MG per tablet 1 tablet (1 tablet Oral Given 04/18/18 2215)  Tdap (BOOSTRIX) injection 0.5 mL (0.5 mLs Intramuscular Given 04/18/18 2215)     Initial Impression / Assessment and Plan / ED Course  I have reviewed the triage vital signs and the nursing notes.  Pertinent labs & imaging results that were available during my care of the patient were reviewed by me and considered in my medical decision making (see chart for details).     Wound irrigated by nursing staff.  Patient's wound continues to slowly drain.  I told her this is good and she will be given Augmentin given she was bitten by her own dog.  Dog's rabies shots are up-to-date.  She will be updated on tetanus.  Plan for wound care and Augmentin prescription at home.  Ibuprofen for pain.  X-ray unremarkable.  NV intact. Discussed strict return precautions.  Final Clinical Impressions(s) / ED Diagnoses   Final diagnoses:  Dog bite of right hand, initial encounter    ED Discharge Orders        Ordered    amoxicillin-clavulanate (AUGMENTIN) 875-125 MG tablet  2 times daily     04/18/18 2305       Pricilla Loveless, MD 04/18/18 2354

## 2018-12-14 IMAGING — CR DG HAND COMPLETE 3+V*R*
3 series · 3 of 3 positions shown · non-contrast
Comparison: None.

CLINICAL DATA: Dog bite to the thenar eminence

EXAM:
RIGHT HAND - COMPLETE 3+ VIEW

[x hand pa right]
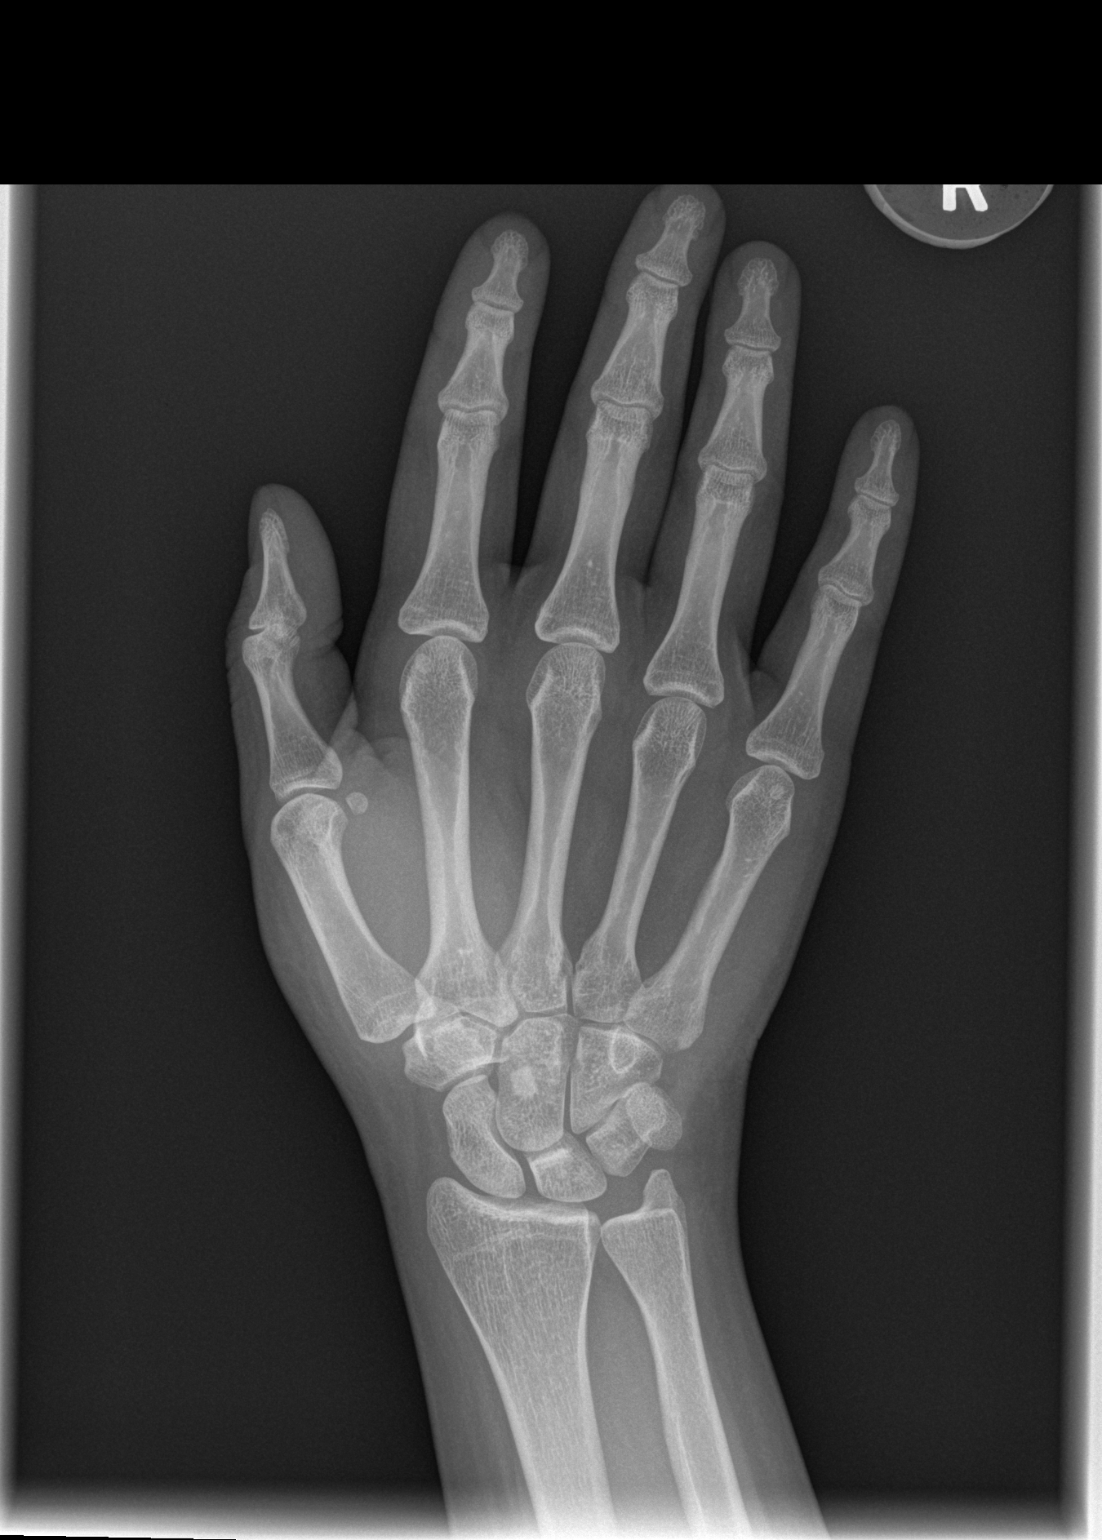

[x hand oblique right]
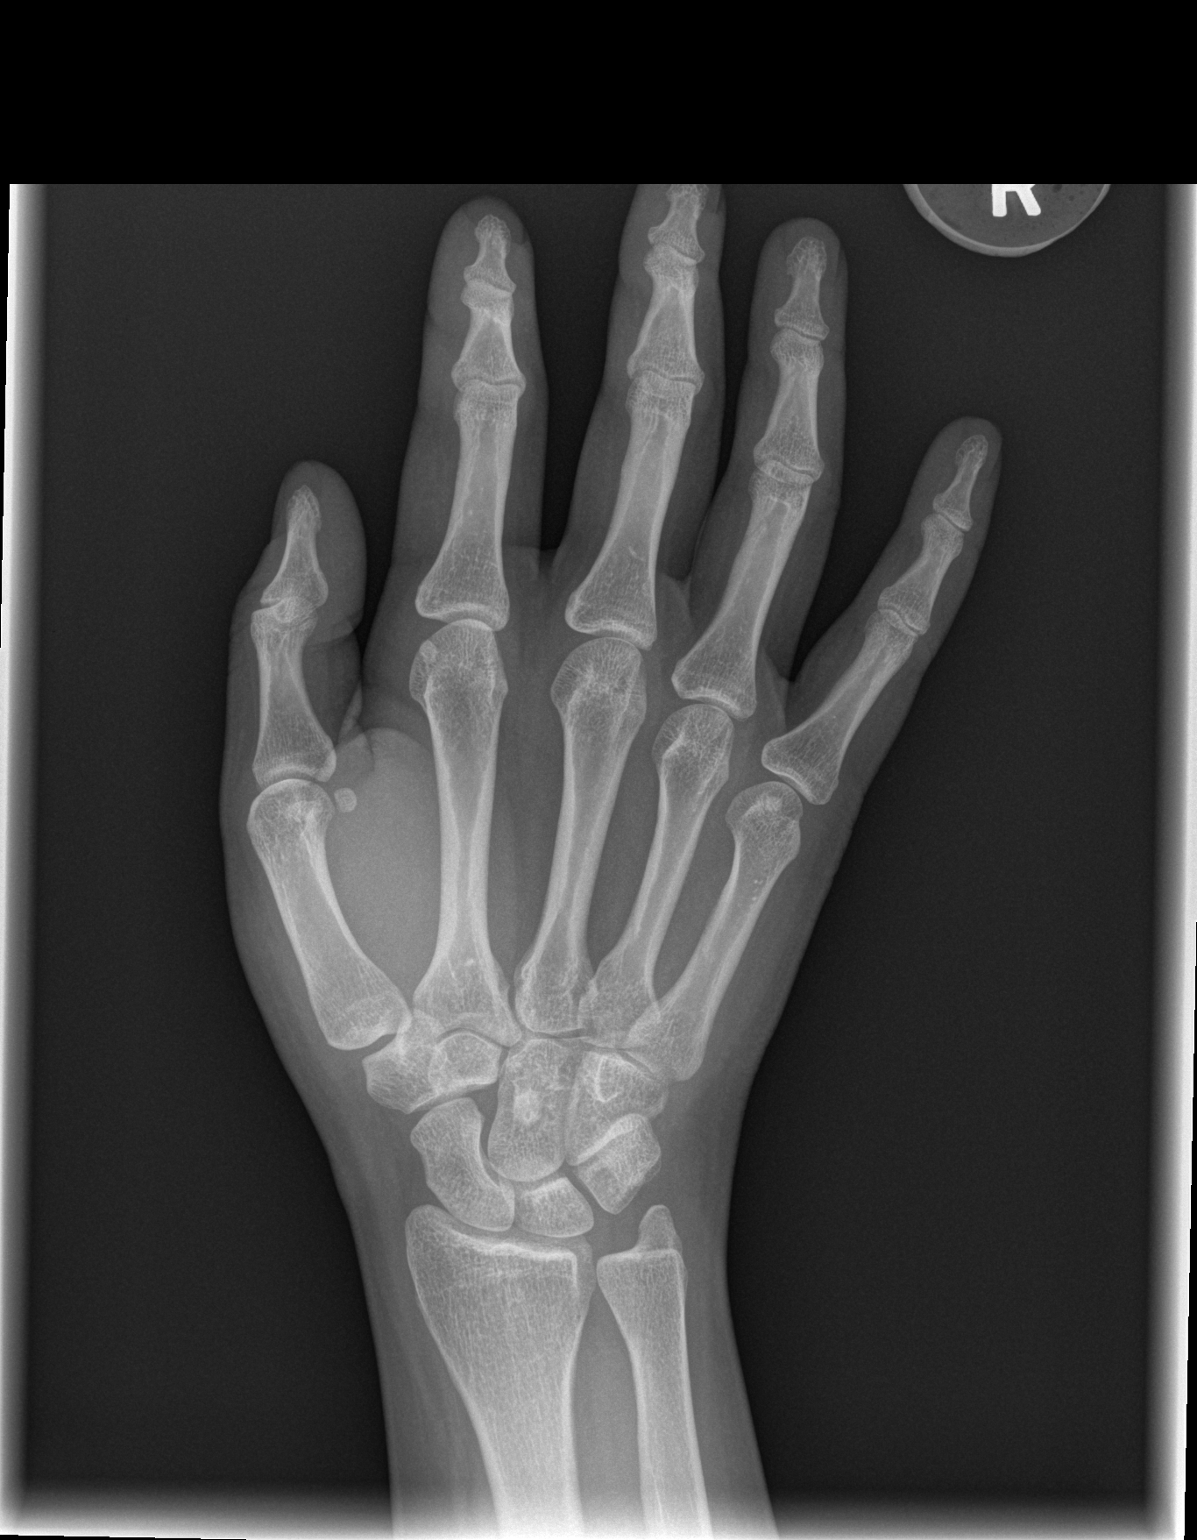

[x hand lat right]
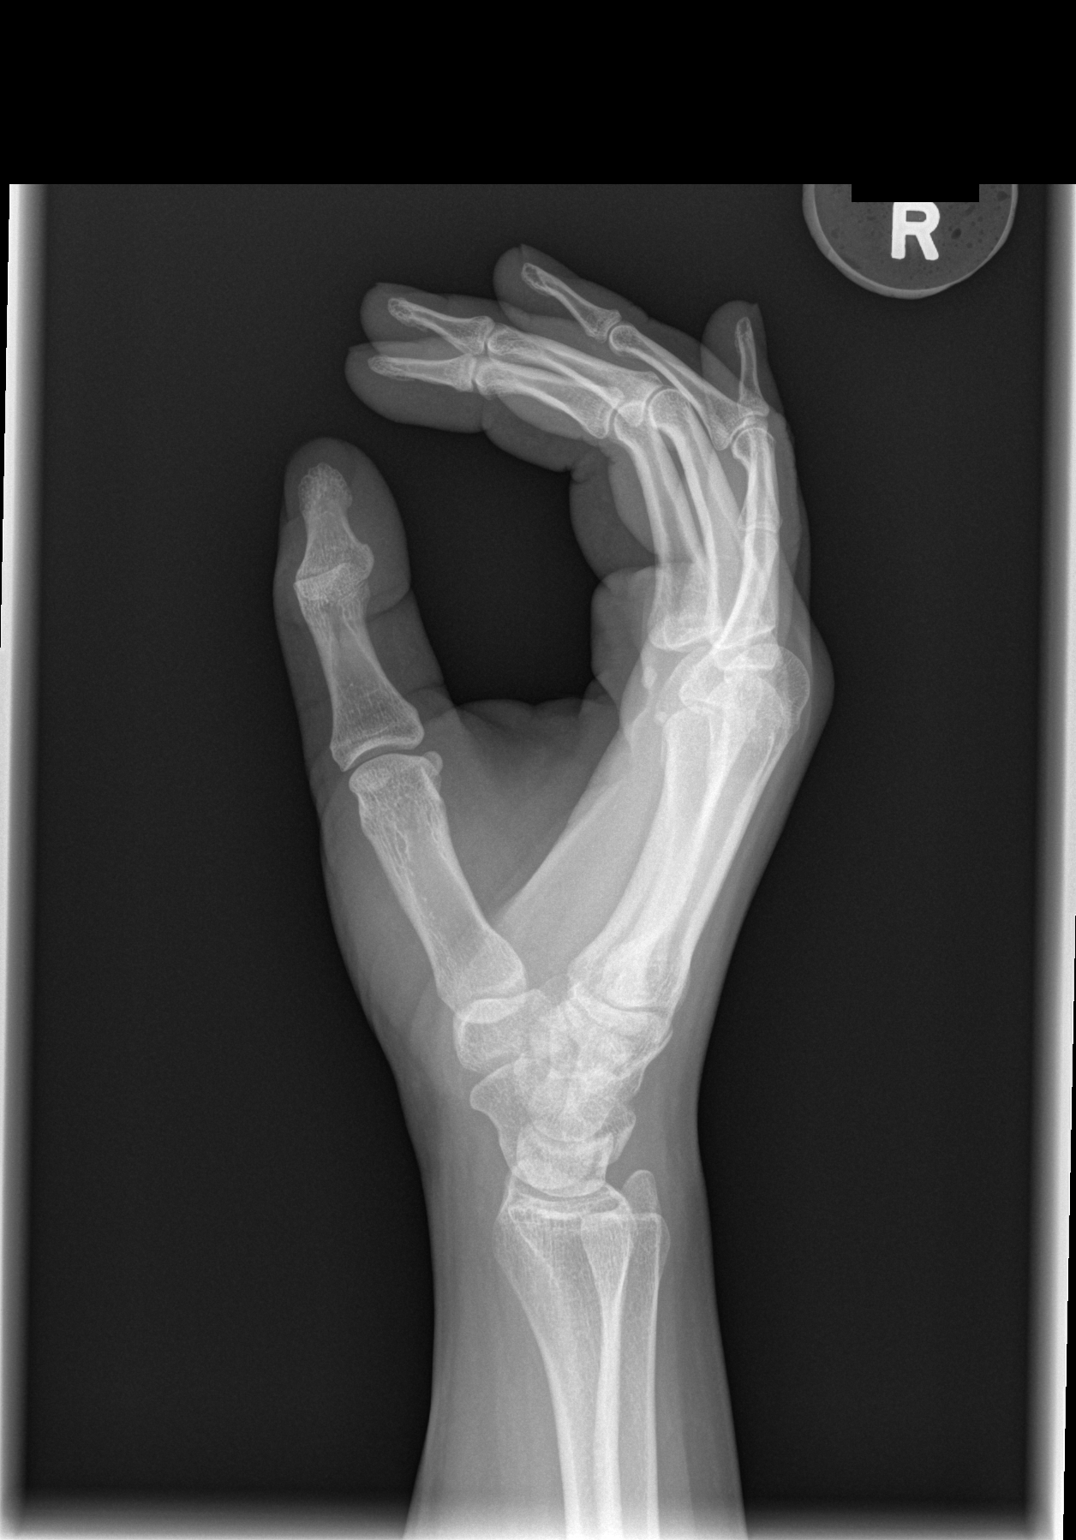

[3 of 3 positions shown; findings below may reference images not displayed]

FINDINGS: There is no evidence of fracture or dislocation. There is no
evidence of arthropathy or other focal bone abnormality. Soft
tissues are unremarkable.
IMPRESSION: Negative.

## 2019-04-22 ENCOUNTER — Other Ambulatory Visit: Payer: Self-pay | Admitting: Obstetrics & Gynecology

## 2019-04-22 DIAGNOSIS — Z01419 Encounter for gynecological examination (general) (routine) without abnormal findings: Secondary | ICD-10-CM

## 2023-01-20 DIAGNOSIS — Z6828 Body mass index (BMI) 28.0-28.9, adult: Secondary | ICD-10-CM | POA: Diagnosis not present

## 2023-01-20 DIAGNOSIS — L239 Allergic contact dermatitis, unspecified cause: Secondary | ICD-10-CM | POA: Diagnosis not present

## 2023-06-19 DIAGNOSIS — Z01419 Encounter for gynecological examination (general) (routine) without abnormal findings: Secondary | ICD-10-CM | POA: Diagnosis not present

## 2023-09-14 ENCOUNTER — Ambulatory Visit: Payer: 59 | Admitting: Family Medicine

## 2023-09-14 VITALS — BP 130/90 | HR 95 | Temp 98.9°F | Resp 12 | Ht 66.0 in | Wt 186.1 lb

## 2023-09-14 DIAGNOSIS — Z1159 Encounter for screening for other viral diseases: Secondary | ICD-10-CM | POA: Diagnosis not present

## 2023-09-14 DIAGNOSIS — Z131 Encounter for screening for diabetes mellitus: Secondary | ICD-10-CM

## 2023-09-14 DIAGNOSIS — K219 Gastro-esophageal reflux disease without esophagitis: Secondary | ICD-10-CM | POA: Diagnosis not present

## 2023-09-14 DIAGNOSIS — I10 Essential (primary) hypertension: Secondary | ICD-10-CM

## 2023-09-14 MED ORDER — OMEPRAZOLE 20 MG PO CPDR: 20.0000 mg | DELAYED_RELEASE_CAPSULE | Freq: Every day | ORAL | 3 refills | Status: DC

## 2023-09-14 NOTE — Assessment & Plan Note (Signed)
Problem is well-controlled. Continue omeprazole 20 mg daily before breakfast and GERD precautions.

## 2023-09-14 NOTE — Assessment & Plan Note (Signed)
Re-checked BP: 130/90. We discussed Dx, prognosis, and treatment options as well as side effects. OCPs could be an aggravating factor, recommend discussing other options with her gynecologist for endometriosis treatment. For now I recommend to start with DASH diet. Monitor BP a.m. and p.m. then once daily, instructed to let me know about BP readings in 3 to 4 weeks. If BP is persistently 130/80 or above, Amlodipine will be recommended. F/U in 6 months.

## 2023-09-14 NOTE — Progress Notes (Unsigned)
HPI: Natalie Ellis is a 32 y.o. female with a PMHx significant for GERD, seasonal allergies, and endometriosis, who is here today to establish care.  Former PCP: not on file Last preventive routine visit: not on file  Exercise: She says she exercises regularly.  Diet: She states she eats a mostly healthy diet.  Sleep: She reports she sleeps ~7 hours per night.  Alcohol Use: She says she doesn't drink much.  Smoking: She smoked cigarettes in 2018 and vaped from 2019-2023.  Vision: She doesn't currently have an eye care provider. Dental: UTD on routine dental care.  She has a gynecologist.   Chronic medical problems:   GERD: She takes omeprazole 20 mg daily OTC.  Problem has been well controlled. Denies having symptoms.  Anxiety:  She says she has some history of anxiety. She is not on pharmacologic treatment.  Concerns today:   Blood pressure: She has noticed her BP was high at several doctors appointments and on an occasional check at home, getting as high as 144/90. Her BP here today is 130/90.  She has tried to avoid salt in her diet.  She mentions both of her parents have HTN. She is not currently taking any decongestants or weight loss medications. She has been on birth control since her surgery for endometriosis in 2018.  Negative for severe/frequent headache, visual changes, chest pain, dyspnea, palpitation, claudication, focal weakness, or edema. No known hx of OSA.  Review of Systems  Constitutional:  Negative for activity change, appetite change and fever.  HENT:  Negative for mouth sores, nosebleeds and trouble swallowing.   Respiratory:  Negative for cough and wheezing.   Gastrointestinal:  Negative for abdominal pain, nausea and vomiting.       Negative for changes in bowel habits.  Endocrine: Negative for cold intolerance and heat intolerance.  Genitourinary:  Negative for decreased urine volume, difficulty urinating, dysuria and hematuria.   Allergic/Immunologic: Positive for environmental allergies.  Neurological:  Negative for syncope and facial asymmetry.  Psychiatric/Behavioral:  Negative for confusion and hallucinations.   See other pertinent positives and negatives in HPI.  Current Outpatient Medications on File Prior to Visit  Medication Sig Dispense Refill   SRONYX 0.1-20 MG-MCG tablet TAKE 1 TABLET BY MOUTH DAILY. NO PLACEBOS 112 tablet 6   No current facility-administered medications on file prior to visit.    Past Medical History:  Diagnosis Date   Allergy    Anxiety    Essential (primary) hypertension 09/14/2023   GERD (gastroesophageal reflux disease)    Seasonal allergies    Allergies  Allergen Reactions   Other     Walnuts and pecans, itchy throat   Family History  Problem Relation Age of Onset   Cancer Mother    Diabetes Mother    Hypertension Mother    Cancer Father    Hypertension Father    COPD Maternal Grandmother    Diabetes Paternal Aunt    Diabetes Maternal Aunt    Diabetes Maternal Uncle     Social History   Socioeconomic History   Marital status: Single    Spouse name: Not on file   Number of children: Not on file   Years of education: Not on file   Highest education level: Master's degree (e.g., MA, MS, MEng, MEd, MSW, MBA)  Occupational History   Not on file  Tobacco Use   Smoking status: Never    Passive exposure: Yes   Smokeless tobacco: Never  Vaping Use  Vaping status: Never Used  Substance and Sexual Activity   Alcohol use: Not Currently    Comment: socially   Drug use: No   Sexual activity: Yes    Birth control/protection: Pill  Other Topics Concern   Not on file  Social History Narrative   Not on file   Social Determinants of Health   Financial Resource Strain: Low Risk  (09/14/2023)   Overall Financial Resource Strain (CARDIA)    Difficulty of Paying Living Expenses: Not very hard  Food Insecurity: No Food Insecurity (09/14/2023)   Hunger Vital  Sign    Worried About Running Out of Food in the Last Year: Never true    Ran Out of Food in the Last Year: Never true  Transportation Needs: No Transportation Needs (09/14/2023)   PRAPARE - Administrator, Civil Service (Medical): No    Lack of Transportation (Non-Medical): No  Physical Activity: Sufficiently Active (09/14/2023)   Exercise Vital Sign    Days of Exercise per Week: 3 days    Minutes of Exercise per Session: 80 min  Stress: Stress Concern Present (09/14/2023)   [employer] of Occupational Health - Occupational Stress Questionnaire    Feeling of Stress : To some extent  Social Connections: Moderately Isolated (09/14/2023)   Social Connection and Isolation Panel [NHANES]    Frequency of Communication with Friends and Family: Once a week    Frequency of Social Gatherings with Friends and Family: Twice a week    Attends Religious Services: Never    Database administrator or Organizations: No    Attends Banker Meetings: Not on file    Marital Status: Living with partner    Vitals:   09/14/23 1519 09/14/23 1545  BP: (!) 130/90 (!) 130/90  Pulse: 95   Resp: 12   Temp: 98.9 F (37.2 C)   SpO2: 99%     Body mass index is 30.04 kg/m.  Physical Exam Vitals and nursing note reviewed.  Constitutional:      General: She is not in acute distress.    Appearance: She is well-developed.  HENT:     Head: Normocephalic and atraumatic.     Mouth/Throat:     Mouth: Mucous membranes are moist.     Pharynx: Oropharynx is clear.  Eyes:     Extraocular Movements: Extraocular movements intact.     Conjunctiva/sclera: Conjunctivae normal.  Neck:     Thyroid: No thyroid mass.  Cardiovascular:     Rate and Rhythm: Normal rate and regular rhythm.     Pulses:          Dorsalis pedis pulses are 2+ on the right side and 2+ on the left side.     Heart sounds: No murmur heard. Pulmonary:     Effort: Pulmonary effort is normal. No respiratory  distress.     Breath sounds: Normal breath sounds.  Abdominal:     Palpations: Abdomen is soft. There is no hepatomegaly or mass.     Tenderness: There is no abdominal tenderness.  Musculoskeletal:     Right lower leg: No edema.     Left lower leg: No edema.  Lymphadenopathy:     Cervical: No cervical adenopathy.  Skin:    General: Skin is warm.     Findings: No erythema or rash.  Neurological:     General: No focal deficit present.     Mental Status: She is alert and oriented to person, place, and time.  Cranial Nerves: No cranial nerve deficit.     Gait: Gait normal.  Psychiatric:        Mood and Affect: Mood and affect normal.   ASSESSMENT AND PLAN:  Ms. Skalicky was seen today to establish care.  *** Gastroesophageal reflux disease without esophagitis Assessment & Plan: Problem is well-controlled. Continue omeprazole 20 mg daily before breakfast and GERD precautions.  Orders: -     Omeprazole; Take 1 capsule (20 mg total) by mouth daily before breakfast.  Dispense: 90 capsule; Refill: 3  Essential (primary) hypertension Assessment & Plan: Re-checked BP: 130/90. We discussed Dx, prognosis, and treatment options as well as side effects. OCPs could be an aggravating factor, recommend discussing other options with her gynecologist for endometriosis treatment. For now I recommend to start with DASH diet. Monitor BP a.m. and p.m. then once daily, instructed to let me know about BP readings in 3 to 4 weeks. If BP is persistently 130/80 or above, Amlodipine will be recommended. F/U in 6 months.  Orders: -     CBC; Future -     Basic metabolic panel; Future -     TSH; Future  Diabetes mellitus screening -     Hemoglobin A1c; Future  Encounter for HCV screening test for low risk patient -     Hepatitis C antibody; Future   Return in about 6 months (around 03/13/2024).  I, Rolla Etienne Wierda, acting as a scribe for Maven Rosander [place], MD., have documented all relevant  documentation on the behalf of Silena Wyss [place], MD, as directed by  Lyndee Herbst [place], MD while in the presence of Kanda Deluna [place], MD.   I, Davia Smyre [place], MD, have reviewed all documentation for this visit. The documentation on 09/14/23 for the exam, diagnosis, procedures, and orders are all accurate and complete.  Dashanique Brownstein G. [place], MD  [facility]. [place] [facility].

## 2023-09-14 NOTE — Patient Instructions (Addendum)
A few things to remember from today's visit:  Elevated blood pressure reading - Plan: CBC, Basic metabolic panel, TSH  Diabetes mellitus screening - Plan: Hemoglobin A1c  Encounter for HCV screening test for low risk patient - Plan: Hepatitis C antibody  Gastroesophageal reflux disease without esophagitis - Plan: omeprazole (PRILOSEC) 20 MG capsule Let me know about blood pressure reading sin 3-4 weeks. Check blood pressure in the morning and at night for 2 weeks then one or the other. Will recommend Amlodipine if blood pressure persistently 130/80 or above.  Do not use My Chart to request refills or for acute issues that need immediate attention. If you send a my chart message, it may take a few days to be addressed, specially if I am not in the office.  Please be sure medication list is accurate. If a new problem present, please set up appointment sooner than planned today.

## 2023-09-15 LAB — BASIC METABOLIC PANEL
BUN: 9 mg/dL (ref 6–23)
CO2: 23 meq/L (ref 19–32)
Calcium: 9.2 mg/dL (ref 8.4–10.5)
Chloride: 105 meq/L (ref 96–112)
Creatinine, Ser: 0.75 mg/dL (ref 0.40–1.20)
GFR: 105.93 mL/min (ref >60.00)
Glucose, Bld: 81 mg/dL (ref 70–99)
Potassium: 3.7 meq/L (ref 3.5–5.1)
Sodium: 138 meq/L (ref 135–145)

## 2023-09-15 LAB — CBC
HCT: 37.4 % (ref 36.0–46.0)
Hemoglobin: 12.4 g/dL (ref 12.0–15.0)
MCHC: 33.2 g/dL (ref 30.0–36.0)
MCV: 88 fL (ref 78.0–100.0)
Platelets: 298 10*3/uL (ref 150.0–400.0)
RBC: 4.25 Mil/uL (ref 3.87–5.11)
RDW: 12.9 % (ref 11.5–15.5)
WBC: 7.7 10*3/uL (ref 4.0–10.5)

## 2023-09-15 LAB — TSH: TSH: 5.13 u[IU]/mL (ref 0.35–5.50)

## 2023-09-15 LAB — HEPATITIS C ANTIBODY: Hepatitis C Ab: NONREACTIVE

## 2023-09-15 LAB — HEMOGLOBIN A1C: Hgb A1c MFr Bld: 5.6 % (ref 4.6–6.5)

## 2024-02-22 ENCOUNTER — Telehealth: Payer: Self-pay

## 2024-02-23 ENCOUNTER — Telehealth: Payer: Self-pay

## 2024-02-23 ENCOUNTER — Other Ambulatory Visit (HOSPITAL_COMMUNITY): Payer: Self-pay

## 2024-02-23 NOTE — Telephone Encounter (Signed)
 PA request has been Approved. New Encounter has been or will be created for follow up. For additional info see Pharmacy Prior Auth telephone encounter from 02/23/24.

## 2024-02-23 NOTE — Telephone Encounter (Signed)
 Pharmacy Patient Advocate Encounter   Received notification from Pt Calls Messages that prior authorization for Omeprazole  20MG  dr capsules is required/requested.   Insurance verification completed.   The patient is insured through [employer] .   Per test claim: PA required and submitted KEY/EOC/Request #: [id number] APPROVED from 02/23/24 to 02/22/25. Ran test claim, Copay is $4. This test claim was processed through [facility]- copay amounts may vary at other pharmacies due to pharmacy/plan contracts, or as the patient moves through the different stages of their insurance plan.

## 2024-03-01 ENCOUNTER — Ambulatory Visit: Admitting: Family Medicine

## 2024-03-30 ENCOUNTER — Ambulatory Visit: Admitting: Family Medicine

## 2024-03-30 ENCOUNTER — Encounter: Payer: Self-pay | Admitting: Family Medicine

## 2024-03-30 VITALS — BP 126/80 | HR 76 | Temp 98.0°F | Resp 12 | Ht 66.0 in | Wt 192.0 lb

## 2024-03-30 DIAGNOSIS — H6991 Unspecified Eustachian tube disorder, right ear: Secondary | ICD-10-CM | POA: Diagnosis not present

## 2024-03-30 DIAGNOSIS — H9201 Otalgia, right ear: Secondary | ICD-10-CM

## 2024-03-30 MED ORDER — CIPROFLOXACIN-DEXAMETHASONE 0.3-0.1 % OT SUSP: 4.0000 [drp] | Freq: Two times a day (BID) | OTIC | 0 refills | Status: AC

## 2024-03-30 NOTE — Progress Notes (Signed)
 ACUTE VISIT Chief Complaint  Patient presents with   Ear Pain    Right ear    HPI: [name] is a 33 y.o. female with a PMHx significant for GERD, seasonal allergies, and endometriosis, who is here today complaining of right ear pain that is started a couple weeks ago.  She localized pain in ear and retroauricular. Problem is intermittent.  Otalgia  There is pain in the right ear. This is a new problem. The current episode started 1 to 4 weeks ago. The problem occurs every few hours. The problem has been unchanged. There has been no fever. Pertinent negatives include no abdominal pain, coughing, diarrhea, ear discharge, headaches, rash, rhinorrhea, sore throat or vomiting. She has tried nothing for the symptoms. There is no history of a chronic ear infection, hearing loss or a tympanostomy tube.   The pain is stable since its onset.  She has not identified exacerbating or alleviating factors. It is not related with TMJ movement or chewing.  Also endorses some muffled hearing occasionally.   She noticed a lump behind her ear a few weeks ago but says it went away.   Not using q-tips in her ears, but she says she picks in them with her finger sometimes.  Pertinent negatives include ear trauma, recent URI, recent air travel or travel to the mountains.   Review of Systems  Constitutional:  Negative for activity change, appetite change and fever.  HENT:  Positive for ear pain. Negative for ear discharge, rhinorrhea and sore throat.   Eyes:  Negative for discharge and redness.  Respiratory:  Negative for cough.   Gastrointestinal:  Negative for abdominal pain, diarrhea and vomiting.  Musculoskeletal:  Negative for myalgias.  Skin:  Negative for rash.  Allergic/Immunologic: Positive for environmental allergies.  Neurological:  Negative for tremors, syncope and headaches.  See other pertinent positives and negatives in HPI.  Current Outpatient Medications on File Prior to  Visit  Medication Sig Dispense Refill   omeprazole  (PRILOSEC) 20 MG capsule Take 1 capsule (20 mg total) by mouth daily before breakfast. 90 capsule 3   SRONYX 0.1-20 MG-MCG tablet TAKE 1 TABLET BY MOUTH DAILY. NO PLACEBOS 112 tablet 6   No current facility-administered medications on file prior to visit.   Past Medical History:  Diagnosis Date   Allergy    Anxiety    Essential (primary) hypertension 09/14/2023   GERD (gastroesophageal reflux disease)    Seasonal allergies    Allergies  Allergen Reactions   Other     Walnuts and pecans, itchy throat    Social History   Socioeconomic History   Marital status: Single    Spouse name: Not on file   Number of children: Not on file   Years of education: Not on file   Highest education level: Master's degree (e.g., MA, MS, MEng, MEd, MSW, MBA)  Occupational History   Not on file  Tobacco Use   Smoking status: Never    Passive exposure: Yes   Smokeless tobacco: Never  Vaping Use   Vaping status: Never Used  Substance and Sexual Activity   Alcohol use: Not Currently    Comment: socially   Drug use: No   Sexual activity: Yes    Birth control/protection: Pill  Other Topics Concern   Not on file  Social History Narrative   Not on file   Social Drivers of Health   Financial Resource Strain: Low Risk  (09/14/2023)   Overall Financial Resource  Strain (CARDIA)    Difficulty of Paying Living Expenses: Not very hard  Food Insecurity: No Food Insecurity (09/14/2023)   Hunger Vital Sign    Worried About Running Out of Food in the Last Year: Never true    Ran Out of Food in the Last Year: Never true  Transportation Needs: No Transportation Needs (09/14/2023)   PRAPARE - Administrator, Civil Service (Medical): No    Lack of Transportation (Non-Medical): No  Physical Activity: Sufficiently Active (09/14/2023)   Exercise Vital Sign    Days of Exercise per Week: 3 days    Minutes of Exercise per Session: 80 min   Stress: Stress Concern Present (09/14/2023)   [employer] of Occupational Health - Occupational Stress Questionnaire    Feeling of Stress : To some extent  Social Connections: Moderately Isolated (09/14/2023)   Social Connection and Isolation Panel [NHANES]    Frequency of Communication with Friends and Family: Once a week    Frequency of Social Gatherings with Friends and Family: Twice a week    Attends Religious Services: Never    Database administrator or Organizations: No    Attends Engineer, structural: Not on file    Marital Status: Living with partner   Vitals:   03/30/24 1435  BP: 126/80  Pulse: 76  Resp: 12  Temp: 98 F (36.7 C)  SpO2: 98%   Body mass index is 30.99 kg/m.  Physical Exam Vitals and nursing note reviewed.  Constitutional:      General: She is not in acute distress.    Appearance: She is well-developed. She is not ill-appearing.  HENT:     Head: Normocephalic and atraumatic.     Right Ear: Ear canal and external ear normal. No tenderness. A middle ear effusion is present. Tympanic membrane is bulging.     Left Ear: External ear normal. Tympanic membrane is not erythematous.     Ears:     Comments: Excess cerumen in left ear canal, TM seen partially. Ear canal of right ear with scaly areas, not tender and not erythematous.    Mouth/Throat:     Mouth: Mucous membranes are moist.     Comments: TMJ crepitus, no pain and no limitation  Eyes:     Conjunctiva/sclera: Conjunctivae normal.  Pulmonary:     Effort: Pulmonary effort is normal. No respiratory distress.  Musculoskeletal:     Cervical back: No edema or erythema.  Lymphadenopathy:     Head:     Right side of head: No preauricular or posterior auricular adenopathy.     Cervical: No cervical adenopathy.  Skin:    General: Skin is warm.     Findings: No erythema or rash.  Neurological:     Mental Status: She is alert and oriented to person, place, and time.  Psychiatric:         Mood and Affect: Mood and affect normal.   ASSESSMENT AND PLAN:  [name] was seen today for ear pain.   Earache on right We discussed possible etiologies. Infectious , eustachian tube dysfunction,and TMJ are in the differential Dx. History and examination today do not suggest a serious process. She reports tender retroauricular "lump" that has resolved,?  Lymphadenopathy.  She could have had an external otitis that has improved. We discussed options at this time, including monitoring for changes versus trying empiric topical medication, she prefers the latter one. Monitor for new symptoms. If problem is persistent in a  couple weeks, ENT consultation can be considered.  -     Ciprofloxacin-dexAMETHasone ; Place 4 drops into the right ear 2 (two) times daily for 7 days.  Dispense: 2.8 mL; Refill: 0  Dysfunction of right eustachian tube Mild middle ear effusion. We discussed diagnosis and prognosis. Recommend auto inflation maneuvers as needed ,when she has fullness sensation. Hearing test today normal.  Hearing Screening   500Hz  1000Hz  2000Hz  4000Hz   Right ear Pass Pass Pass Pass  Left ear Pass Pass Pass Pass   I spent a total of 30 minutes in both face to face and non face to face activities for this visit on the date of this encounter. During this time history was obtained and documented, examination was performed, and assessment/plan discussed.  Return if symptoms worsen or fail to improve, for keep next appointment.  I, Fritz Jewel Wierda, acting as a scribe for Joniqua Sidle [place], MD., have documented all relevant documentation on the behalf of Timesha Cervantez [place], MD, as directed by  Robbyn Hodkinson [place], MD while in the presence of Asmaa Tirpak [place], MD.   I, Delmore Sear [place], MD, have reviewed all documentation for this visit. The documentation on 03/30/24 for the exam, diagnosis, procedures, and orders are all accurate and complete.  Jayr Lupercio G. [place], MD  [facility]. [place]  office.

## 2024-03-30 NOTE — Patient Instructions (Addendum)
 A few things to remember from today's visit:  Earache on right  Dysfunction of right eustachian tube Not sure about the exact cause but it could be related to eustachian dysfunction. Pop your ears when you feel fullness sensation.  If you need refills for medications you take chronically, please call your pharmacy. Do not use My Chart to request refills or for acute issues that need immediate attention. If you send a my chart message, it may take a few days to be addressed, specially if I am not in the office.  Please be sure medication list is accurate. If a new problem present, please set up appointment sooner than planned today.

## 2024-06-20 DIAGNOSIS — Z01419 Encounter for gynecological examination (general) (routine) without abnormal findings: Secondary | ICD-10-CM | POA: Diagnosis not present

## 2024-09-27 ENCOUNTER — Other Ambulatory Visit: Payer: Self-pay | Admitting: Family Medicine

## 2024-09-27 DIAGNOSIS — K219 Gastro-esophageal reflux disease without esophagitis: Secondary | ICD-10-CM
